# Patient Record
Sex: Female | Born: 1935 | Race: White | Hispanic: No | Smoking: Never smoker
Health system: Southern US, Community
[De-identification: ages and names within clinical notes are randomized; demographics above are authoritative.]

## PROBLEM LIST (undated history)

## (undated) DIAGNOSIS — I10 Essential (primary) hypertension: Secondary | ICD-10-CM

## (undated) DIAGNOSIS — F0390 Unspecified dementia without behavioral disturbance: Secondary | ICD-10-CM

## (undated) DIAGNOSIS — M8580 Other specified disorders of bone density and structure, unspecified site: Secondary | ICD-10-CM

## (undated) DIAGNOSIS — C50919 Malignant neoplasm of unspecified site of unspecified female breast: Secondary | ICD-10-CM

## (undated) DIAGNOSIS — H5440 Blindness, one eye, unspecified eye: Secondary | ICD-10-CM

## (undated) DIAGNOSIS — M1990 Unspecified osteoarthritis, unspecified site: Secondary | ICD-10-CM

## (undated) HISTORY — DX: Essential (primary) hypertension: I10

## (undated) HISTORY — DX: Other specified disorders of bone density and structure, unspecified site: M85.80

## (undated) HISTORY — DX: Unspecified osteoarthritis, unspecified site: M19.90

## (undated) HISTORY — PX: TOTAL HIP ARTHROPLASTY: SHX124

## (undated) HISTORY — PX: ABDOMINAL HYSTERECTOMY: SHX81

## (undated) HISTORY — DX: Malignant neoplasm of unspecified site of unspecified female breast: C50.919

## (undated) HISTORY — PX: BACK SURGERY: SHX140

## (undated) HISTORY — PX: BREAST LUMPECTOMY: SHX2

---

## 2000-12-22 ENCOUNTER — Encounter: Admission: RE | Admit: 2000-12-22 | Discharge: 2000-12-22 | Payer: Self-pay | Admitting: Family Medicine

## 2000-12-22 ENCOUNTER — Encounter: Payer: Self-pay | Admitting: Family Medicine

## 2001-05-18 ENCOUNTER — Encounter: Payer: Self-pay | Admitting: Orthopedic Surgery

## 2001-05-22 ENCOUNTER — Inpatient Hospital Stay (HOSPITAL_COMMUNITY): Admission: RE | Admit: 2001-05-22 | Discharge: 2001-05-27 | Payer: Self-pay | Admitting: Orthopedic Surgery

## 2001-05-22 ENCOUNTER — Encounter: Payer: Self-pay | Admitting: Orthopedic Surgery

## 2001-10-08 ENCOUNTER — Emergency Department (HOSPITAL_COMMUNITY): Admission: EM | Admit: 2001-10-08 | Discharge: 2001-10-09 | Payer: Self-pay | Admitting: *Deleted

## 2002-06-05 ENCOUNTER — Encounter: Payer: Self-pay | Admitting: Orthopedic Surgery

## 2002-06-11 ENCOUNTER — Inpatient Hospital Stay (HOSPITAL_COMMUNITY): Admission: RE | Admit: 2002-06-11 | Discharge: 2002-06-16 | Payer: Self-pay | Admitting: Orthopedic Surgery

## 2002-06-11 ENCOUNTER — Encounter: Payer: Self-pay | Admitting: Orthopedic Surgery

## 2003-11-08 ENCOUNTER — Encounter: Admission: RE | Admit: 2003-11-08 | Discharge: 2003-11-08 | Payer: Self-pay | Admitting: Gastroenterology

## 2004-08-06 ENCOUNTER — Ambulatory Visit (HOSPITAL_COMMUNITY): Admission: RE | Admit: 2004-08-06 | Discharge: 2004-08-07 | Payer: Self-pay | Admitting: Ophthalmology

## 2004-08-11 ENCOUNTER — Ambulatory Visit (HOSPITAL_COMMUNITY): Admission: RE | Admit: 2004-08-11 | Discharge: 2004-08-12 | Payer: Self-pay | Admitting: Ophthalmology

## 2010-06-07 ENCOUNTER — Encounter: Payer: Self-pay | Admitting: Gastroenterology

## 2010-12-14 ENCOUNTER — Encounter: Payer: Self-pay | Admitting: Family Medicine

## 2010-12-14 DIAGNOSIS — I10 Essential (primary) hypertension: Secondary | ICD-10-CM | POA: Insufficient documentation

## 2012-08-09 ENCOUNTER — Other Ambulatory Visit: Payer: Self-pay | Admitting: Family Medicine

## 2012-08-14 ENCOUNTER — Telehealth: Payer: Self-pay | Admitting: Physician Assistant

## 2012-08-14 DIAGNOSIS — E039 Hypothyroidism, unspecified: Secondary | ICD-10-CM

## 2012-08-14 MED ORDER — LEVOTHYROXINE SODIUM 25 MCG PO TABS: 25.0000 ug | ORAL_TABLET | Freq: Every day | ORAL | Status: AC

## 2012-08-14 NOTE — Telephone Encounter (Signed)
Patient needs to be seen before any further refillsMedication refilled per protocol.

## 2012-12-08 ENCOUNTER — Emergency Department (INDEPENDENT_AMBULATORY_CARE_PROVIDER_SITE_OTHER): Payer: MEDICARE

## 2012-12-08 ENCOUNTER — Emergency Department (INDEPENDENT_AMBULATORY_CARE_PROVIDER_SITE_OTHER)
Admission: EM | Admit: 2012-12-08 | Discharge: 2012-12-08 | Disposition: A | Discharge status: Home / Self Care | Payer: MEDICARE | Source: Home / Self Care

## 2012-12-08 ENCOUNTER — Encounter (HOSPITAL_COMMUNITY): Payer: Self-pay

## 2012-12-08 DIAGNOSIS — S8012XA Contusion of left lower leg, initial encounter: Secondary | ICD-10-CM

## 2012-12-08 DIAGNOSIS — S8010XA Contusion of unspecified lower leg, initial encounter: Secondary | ICD-10-CM

## 2012-12-08 DIAGNOSIS — G5600 Carpal tunnel syndrome, unspecified upper limb: Secondary | ICD-10-CM

## 2012-12-08 DIAGNOSIS — M5030 Other cervical disc degeneration, unspecified cervical region: Secondary | ICD-10-CM

## 2012-12-08 DIAGNOSIS — W19XXXA Unspecified fall, initial encounter: Secondary | ICD-10-CM

## 2012-12-08 DIAGNOSIS — N390 Urinary tract infection, site not specified: Secondary | ICD-10-CM

## 2012-12-08 DIAGNOSIS — G5603 Carpal tunnel syndrome, bilateral upper limbs: Secondary | ICD-10-CM

## 2012-12-08 HISTORY — DX: Blindness, one eye, unspecified eye: H54.40

## 2012-12-08 LAB — POCT URINALYSIS DIP (DEVICE)
Glucose, UA: NEGATIVE mg/dL
Ketones, ur: NEGATIVE mg/dL
Nitrite: NEGATIVE
Protein, ur: NEGATIVE mg/dL

## 2012-12-08 MED ORDER — CIPROFLOXACIN HCL 250 MG PO TABS: 250.0000 mg | ORAL_TABLET | Freq: Two times a day (BID) | ORAL | Status: DC

## 2012-12-08 NOTE — ED Notes (Addendum)
Reportedly fell earlier today in her home bathroom (walking w/o her walker) c/o pain both lower extremities on palpation . Denies LOC or head injury, or soiling herself. No pain w palpation of t-spine or L-spine or new pain C-spine (states her c-spine hurts "all the time")

## 2012-12-14 NOTE — ED Provider Notes (Signed)
CSN: [id number]     Arrival date & time 12/08/12  1500 History     First MD Initiated Contact with Patient 12/08/12 1621     Chief Complaint  Patient presents with  . Fall   (Consider location/radiation/quality/duration/timing/severity/associated sxs/prior Treatment) HPI  79 yo wf comes in with with above complaint.  With her daughter today.  States that she fell in her home when she was walking without her walker.  She lost her balance.  Complaining of left lower leg pain and neck pain.  Chronic hx of neck pain with know DDD.  She is s/p bilat total hip replacements by dr Despina Hick several years ago.  Denies upper/lower ext radicular symptoms.  Patient also has a low grade fever today.  Denies feeling sick. Denies dysuria, hematuria, abd pain, nausea, vomiting, cough, sob.    Past Medical History  Diagnosis Date  . Breast cancer   . Osteoarthritis   . Osteopenia   . Hypertension   . Blind right eye    Past Surgical History  Procedure Laterality Date  . Total hip arthroplasty      2  . Back surgery    . Breast lumpectomy    . Abdominal hysterectomy     History reviewed. No pertinent family history. History  Substance Use Topics  . Smoking status: Not on file  . Smokeless tobacco: Not on file  . Alcohol Use: Not on file   OB History   Grav Para Term Preterm Abortions TAB SAB Ect Mult Living                 Review of Systems  Constitutional: Negative.   HENT: Negative.   Eyes: Negative.   Respiratory: Negative.   Cardiovascular: Negative.   Gastrointestinal: Negative.   Genitourinary: Negative.   Musculoskeletal:       Neck pain, and left lower leg pain.    Skin: Negative.   Neurological: Negative.   Psychiatric/Behavioral: Negative.     Allergies  Benazepril and Norvasc  Home Medications   Current Outpatient Rx  Name  Route  Sig  Dispense  Refill  . amLODipine (NORVASC) 5 MG tablet   Oral   Take 5 mg by mouth daily.           . celecoxib (CELEBREX) 200  MG capsule   Oral   Take 200 mg by mouth 2 (two) times daily.           . Cholecalciferol (VITAMIN D) 2000 UNITS CAPS   Oral   Take by mouth.           . ciprofloxacin (CIPRO) 250 MG tablet   Oral   Take 1 tablet (250 mg total) by mouth every 12 (twelve) hours.   14 tablet   0   . furosemide (LASIX) 20 MG tablet   Oral   Take 20 mg by mouth daily.           Marland Kitchen levothyroxine (SYNTHROID, LEVOTHROID) 25 MCG tablet   Oral   Take 1 tablet (25 mcg total) by mouth daily.   30 tablet   0     Patient needs to be seen before any further refill ...   . ranitidine (ZANTAC) 75 MG tablet   Oral   Take 75 mg by mouth 2 (two) times daily.            BP 130/63  Pulse 65  Temp(Src) 99.4 F (37.4 C) (Oral)  Resp 18  SpO2 96%  Physical Exam  Constitutional: She appears well-developed and well-nourished.  HENT:  Head: Normocephalic and atraumatic.  Eyes: EOM are normal. Pupils are equal, round, and reactive to light.  Neck: Normal range of motion. Neck supple.  Pulmonary/Chest: Effort normal.  Abdominal: Soft.  Musculoskeletal: Normal range of motion. She exhibits tenderness (distal left tibia.  no bruising, swelling.  good painless rom bilat hips and knees.  ).  Neurological: She is alert.  Skin: Skin is warm and dry.  Psychiatric: She has a normal mood and affect.    ED Course   Procedures (including critical care time)  Labs Reviewed  POCT URINALYSIS DIP (DEVICE) - Abnormal; Notable for the following:    Hgb urine dipstick SMALL (*)    Leukocytes, UA TRACE (*)    All other components within normal limits   No results found. 1. Contusion of left leg, initial encounter   2. DDD (degenerative disc disease), cervical   3. Fall, initial encounter   4. Carpal tunnel syndrome on both sides   5. UTI (lower urinary tract infection)     MDM  Patient and daughter advised there were no fractures seen on xray today.  Does have marked cervical ddd/spondylosis.  No acute  changes.  bilat hip replacements intact.  Encouraged to use her walker at all times when she is ambulating.  Needs to f/u with dr Despina Hick in 1-2 weeks for recheck.  Return to urgent care or go to ED if needs to be seen sooner.  Voices understanding.  Given a ASO for distal tibia/ankle pain.    Zonia Kief, PA-C 12/14/12 1450

## 2012-12-14 NOTE — ED Provider Notes (Signed)
Medical screening examination/treatment/procedure(s) were performed by resident physician or non-physician practitioner and as supervising physician I was immediately available for consultation/collaboration.   Deionna Marcantonio DOUGLAS MD.   Holly Mckee D Charmin Aguiniga, MD 12/14/12 2018 

## 2014-04-30 ENCOUNTER — Other Ambulatory Visit: Payer: Self-pay | Admitting: Ophthalmology

## 2014-05-06 ENCOUNTER — Encounter (HOSPITAL_COMMUNITY): Admission: RE | Payer: Self-pay | Source: Ambulatory Visit

## 2014-05-06 ENCOUNTER — Ambulatory Visit (HOSPITAL_COMMUNITY): Admission: RE | Admit: 2014-05-06 | Payer: MEDICARE | Source: Ambulatory Visit | Admitting: Ophthalmology

## 2014-05-06 SURGERY — EXAM UNDER ANESTHESIA, EYE
Anesthesia: Monitor Anesthesia Care | Laterality: Right

## 2014-10-22 ENCOUNTER — Inpatient Hospital Stay (HOSPITAL_COMMUNITY)
Admission: EM | Admit: 2014-10-22 | Discharge: 2014-10-25 | DRG: 690 | Disposition: A | Discharge status: Skilled Nursing Facility | Payer: MEDICARE | Attending: Family Medicine | Admitting: Family Medicine

## 2014-10-22 ENCOUNTER — Encounter (HOSPITAL_COMMUNITY): Payer: Self-pay | Admitting: *Deleted

## 2014-10-22 ENCOUNTER — Emergency Department (HOSPITAL_COMMUNITY): Payer: MEDICARE

## 2014-10-22 DIAGNOSIS — I248 Other forms of acute ischemic heart disease: Secondary | ICD-10-CM | POA: Diagnosis present

## 2014-10-22 DIAGNOSIS — Z8249 Family history of ischemic heart disease and other diseases of the circulatory system: Secondary | ICD-10-CM

## 2014-10-22 DIAGNOSIS — N3000 Acute cystitis without hematuria: Secondary | ICD-10-CM | POA: Diagnosis not present

## 2014-10-22 DIAGNOSIS — R7989 Other specified abnormal findings of blood chemistry: Secondary | ICD-10-CM

## 2014-10-22 DIAGNOSIS — B9620 Unspecified Escherichia coli [E. coli] as the cause of diseases classified elsewhere: Secondary | ICD-10-CM | POA: Diagnosis present

## 2014-10-22 DIAGNOSIS — M8580 Other specified disorders of bone density and structure, unspecified site: Secondary | ICD-10-CM | POA: Diagnosis present

## 2014-10-22 DIAGNOSIS — E860 Dehydration: Secondary | ICD-10-CM | POA: Diagnosis present

## 2014-10-22 DIAGNOSIS — M1990 Unspecified osteoarthritis, unspecified site: Secondary | ICD-10-CM | POA: Insufficient documentation

## 2014-10-22 DIAGNOSIS — R778 Other specified abnormalities of plasma proteins: Secondary | ICD-10-CM

## 2014-10-22 DIAGNOSIS — H5441 Blindness, right eye, normal vision left eye: Secondary | ICD-10-CM | POA: Diagnosis present

## 2014-10-22 DIAGNOSIS — Z96649 Presence of unspecified artificial hip joint: Secondary | ICD-10-CM | POA: Diagnosis present

## 2014-10-22 DIAGNOSIS — R509 Fever, unspecified: Secondary | ICD-10-CM | POA: Diagnosis not present

## 2014-10-22 DIAGNOSIS — Z853 Personal history of malignant neoplasm of breast: Secondary | ICD-10-CM

## 2014-10-22 DIAGNOSIS — E876 Hypokalemia: Secondary | ICD-10-CM | POA: Diagnosis present

## 2014-10-22 DIAGNOSIS — E039 Hypothyroidism, unspecified: Secondary | ICD-10-CM | POA: Insufficient documentation

## 2014-10-22 DIAGNOSIS — N179 Acute kidney failure, unspecified: Secondary | ICD-10-CM | POA: Diagnosis present

## 2014-10-22 DIAGNOSIS — R531 Weakness: Secondary | ICD-10-CM | POA: Diagnosis not present

## 2014-10-22 DIAGNOSIS — I10 Essential (primary) hypertension: Secondary | ICD-10-CM | POA: Diagnosis not present

## 2014-10-22 DIAGNOSIS — N390 Urinary tract infection, site not specified: Principal | ICD-10-CM | POA: Diagnosis present

## 2014-10-22 DIAGNOSIS — D72829 Elevated white blood cell count, unspecified: Secondary | ICD-10-CM | POA: Diagnosis not present

## 2014-10-22 DIAGNOSIS — R6883 Chills (without fever): Secondary | ICD-10-CM | POA: Diagnosis not present

## 2014-10-22 DIAGNOSIS — R0902 Hypoxemia: Secondary | ICD-10-CM

## 2014-10-22 DIAGNOSIS — D649 Anemia, unspecified: Secondary | ICD-10-CM | POA: Diagnosis not present

## 2014-10-22 DIAGNOSIS — M7989 Other specified soft tissue disorders: Secondary | ICD-10-CM | POA: Diagnosis not present

## 2014-10-22 LAB — COMPREHENSIVE METABOLIC PANEL
ALBUMIN: 3.3 g/dL — AB (ref 3.5–5.0)
ALT: 22 U/L (ref 14–54)
AST: 29 U/L (ref 15–41)
Alkaline Phosphatase: 117 U/L (ref 38–126)
Anion gap: 9 (ref 5–15)
BILIRUBIN TOTAL: 1.3 mg/dL — AB (ref 0.3–1.2)
BUN: 27 mg/dL — AB (ref 6–20)
CALCIUM: 9.8 mg/dL (ref 8.9–10.3)
CHLORIDE: 105 mmol/L (ref 101–111)
CO2: 22 mmol/L (ref 22–32)
Creatinine, Ser: 1.25 mg/dL — ABNORMAL HIGH (ref 0.44–1.00)
GFR calc Af Amer: 42 mL/min — ABNORMAL LOW (ref >60)
GFR, EST NON AFRICAN AMERICAN: 36 mL/min — AB (ref >60)
Glucose, Bld: 106 mg/dL — ABNORMAL HIGH (ref 65–99)
POTASSIUM: 3 mmol/L — AB (ref 3.5–5.1)
SODIUM: 136 mmol/L (ref 135–145)
Total Protein: 6.2 g/dL — ABNORMAL LOW (ref 6.5–8.1)

## 2014-10-22 LAB — CBC WITH DIFFERENTIAL/PLATELET
Basophils Absolute: 0 10*3/uL (ref 0.0–0.1)
Basophils Relative: 0 % (ref 0–1)
EOS ABS: 0 10*3/uL (ref 0.0–0.7)
Eosinophils Relative: 0 % (ref 0–5)
HCT: 35.2 % — ABNORMAL LOW (ref 36.0–46.0)
Hemoglobin: 11.6 g/dL — ABNORMAL LOW (ref 12.0–15.0)
LYMPHS ABS: 0.3 10*3/uL — AB (ref 0.7–4.0)
LYMPHS PCT: 3 % — AB (ref 12–46)
MCH: 31.2 pg (ref 26.0–34.0)
MCHC: 33 g/dL (ref 30.0–36.0)
MCV: 94.6 fL (ref 78.0–100.0)
Monocytes Absolute: 0.1 10*3/uL (ref 0.1–1.0)
Monocytes Relative: 2 % — ABNORMAL LOW (ref 3–12)
NEUTROS ABS: 8.4 10*3/uL — AB (ref 1.7–7.7)
Neutrophils Relative %: 95 % — ABNORMAL HIGH (ref 43–77)
Platelets: 103 10*3/uL — ABNORMAL LOW (ref 150–400)
RBC: 3.72 MIL/uL — AB (ref 3.87–5.11)
RDW: 13.6 % (ref 11.5–15.5)
WBC: 8.8 10*3/uL (ref 4.0–10.5)

## 2014-10-22 LAB — URINE MICROSCOPIC-ADD ON

## 2014-10-22 LAB — BRAIN NATRIURETIC PEPTIDE: B NATRIURETIC PEPTIDE 5: 739.7 pg/mL — AB (ref 0.0–100.0)

## 2014-10-22 LAB — URINALYSIS, ROUTINE W REFLEX MICROSCOPIC
BILIRUBIN URINE: NEGATIVE
Glucose, UA: NEGATIVE mg/dL
KETONES UR: NEGATIVE mg/dL
NITRITE: POSITIVE — AB
PROTEIN: 30 mg/dL — AB
SPECIFIC GRAVITY, URINE: 1.012 (ref 1.005–1.030)
Urobilinogen, UA: 1 mg/dL (ref 0.0–1.0)
pH: 5 (ref 5.0–8.0)

## 2014-10-22 LAB — TROPONIN I: TROPONIN I: 0.1 ng/mL — AB (ref <0.031)

## 2014-10-22 MED ORDER — DEXTROSE 5 % IV SOLN
1.0000 g | Freq: Once | INTRAVENOUS | Status: AC
  Administered 2014-10-22: 1 g via INTRAVENOUS
  Filled 2014-10-22: qty 10

## 2014-10-22 MED ORDER — ACETAMINOPHEN 650 MG RE SUPP: 650.0000 mg | Freq: Four times a day (QID) | RECTAL | Status: DC | PRN

## 2014-10-22 MED ORDER — DEXTROSE 5 % IV SOLN
1.0000 g | INTRAVENOUS | Status: DC
  Administered 2014-10-23 – 2014-10-24 (×2): 1 g via INTRAVENOUS
  Filled 2014-10-22 (×4): qty 10

## 2014-10-22 MED ORDER — ONDANSETRON HCL 4 MG/2ML IJ SOLN: 4.0000 mg | Freq: Four times a day (QID) | INTRAMUSCULAR | Status: DC | PRN

## 2014-10-22 MED ORDER — ALUM & MAG HYDROXIDE-SIMETH 200-200-20 MG/5ML PO SUSP: 30.0000 mL | Freq: Four times a day (QID) | ORAL | Status: DC | PRN

## 2014-10-22 MED ORDER — ONDANSETRON HCL 4 MG PO TABS: 4.0000 mg | ORAL_TABLET | Freq: Four times a day (QID) | ORAL | Status: DC | PRN

## 2014-10-22 MED ORDER — POTASSIUM CHLORIDE 10 MEQ/100ML IV SOLN
10.0000 meq | INTRAVENOUS | Status: AC
  Administered 2014-10-23 (×3): 10 meq via INTRAVENOUS
  Filled 2014-10-22 (×3): qty 100

## 2014-10-22 MED ORDER — LEVOTHYROXINE SODIUM 25 MCG PO TABS
25.0000 ug | ORAL_TABLET | Freq: Every day | ORAL | Status: DC
  Administered 2014-10-23 – 2014-10-25 (×3): 25 ug via ORAL
  Filled 2014-10-22 (×4): qty 1

## 2014-10-22 MED ORDER — ENOXAPARIN SODIUM 40 MG/0.4ML ~~LOC~~ SOLN
40.0000 mg | SUBCUTANEOUS | Status: DC
  Administered 2014-10-23 – 2014-10-25 (×3): 40 mg via SUBCUTANEOUS
  Filled 2014-10-22 (×3): qty 0.4

## 2014-10-22 MED ORDER — SODIUM CHLORIDE 0.9 % IV SOLN
INTRAVENOUS | Status: DC
  Administered 2014-10-23: via INTRAVENOUS

## 2014-10-22 MED ORDER — ACETAMINOPHEN 325 MG PO TABS
650.0000 mg | ORAL_TABLET | Freq: Four times a day (QID) | ORAL | Status: DC | PRN
Start: 2014-10-22 — End: 2014-10-25
  Administered 2014-10-23 – 2014-10-25 (×3): 650 mg via ORAL
  Filled 2014-10-22 (×3): qty 2

## 2014-10-22 NOTE — ED Notes (Signed)
Bed: WA07 Expected date:  Expected time:  Means of arrival:  Comments: EMS 81 yr old hypothermia

## 2014-10-22 NOTE — ED Notes (Signed)
Patient transported to X-ray 

## 2014-10-22 NOTE — ED Notes (Signed)
Pt states she is unsure of why she is here, when asked about urine pt does states she has had a odor and more incontinence, denies any other symptoms, denies pain, pts family states she hasn't been feeling good past couple days and this evening was shivering and then stopped and felt like she had a fever.

## 2014-10-22 NOTE — H&P (Addendum)
History and Physical  Holly Mckee YTK:[id number] DOB: 10-26-1919 DOA: 10/22/2014  Referring physician:  Dr. Alvino Chapel, ED physician PCP: Delia Chimes, NP   Chief Complaint:  chills  HPI: Holly Mckee is a 81 y.o. female   with a history of hypertension, breast cancer approximately 4 years ago, osteoarthritis, osteopenia , hypothyroidism who was brought to the emergency department due to complaints of fevers and chills. This started earlier today, with no palliating or provoking factors. She also complains of generalized malaise over the past week which is increased.   Review of Systems:   Pt denies any  Chest pain, shortness of breath, palpitations, abdominal pain, nausea, vomitingdiarrhea, constipation, decreased appetite , dysuria, polyuria.  Review of systems are otherwise negative  Past Medical History  Diagnosis Date  . Breast cancer   . Osteoarthritis   . Osteopenia   . Hypertension   . Blind right eye    Past Surgical History  Procedure Laterality Date  . Total hip arthroplasty      2  . Back surgery    . Breast lumpectomy    . Abdominal hysterectomy     Social History:  reports that she does not drink alcohol or use illicit drugs. Her tobacco history is not on file. Patient lives at  home & is able to participate in activities of daily living   Allergies  Allergen Reactions  . Benazepril   . Norvasc [Amlodipine Besylate]     Reaction:Edema    Family History  Problem Relation Age of Onset  . Hypertension Father   . Hypertension Sister      Prior to Admission medications   Medication Sig Start Date End Date Taking? Authorizing Provider  amLODipine (NORVASC) 5 MG tablet Take 5 mg by mouth daily.      Historical Provider, MD  celecoxib (CELEBREX) 200 MG capsule Take 200 mg by mouth 2 (two) times daily.      Historical Provider, MD  Cholecalciferol (VITAMIN D) 2000 UNITS CAPS Take by mouth.      Historical Provider, MD  ciprofloxacin (CIPRO) 250 MG  tablet Take 1 tablet (250 mg total) by mouth every 12 (twelve) hours. 12/08/12   Lanae Crumbly, PA-C  furosemide (LASIX) 20 MG tablet Take 20 mg by mouth daily.      Historical Provider, MD  levothyroxine (SYNTHROID, LEVOTHROID) 25 MCG tablet Take 1 tablet (25 mcg total) by mouth daily. 08/14/12   Lonie Peak Dixon, PA-C  ranitidine (ZANTAC) 75 MG tablet Take 75 mg by mouth 2 (two) times daily.      Historical Provider, MD    Physical Exam: BP 88/50 mmHg  Pulse 84  Temp(Src) 98.8 F (37.1 C) (Oral)  Resp 20  SpO2 97%  General:  Elderly Caucasian female. Awake and alert and oriented x3. No acute cardiopulmonary distress.  Eyes: Pupils equal, round, reactive to light. Extraocular muscles are intact. Sclerae anicteric and noninjected.  ENT:   dry mucosal membranes. No mucosal lesions.   Neck: Neck supple without lymphadenopathy. No carotid bruits. No masses palpated.  Cardiovascular: Regular rate with normal S1-S2 sounds. No murmurs, rubs, gallops auscultated. No JVD.  Respiratory: Good respiratory effort with no wheezes, rales, rhonchi. Lungs clear to auscultation bilaterally.  Abdomen: Soft, nontender, nondistended. Active bowel sounds. No masses or hepatosplenomegaly  Skin: Dry, warm to touch. 2+ dorsalis pedis and radial pulses. Musculoskeletal: No calf or leg pain. All major joints not erythematous nontender.  Psychiatric: Intact judgment and insight.  Neurologic: No  focal neurological deficits. Cranial nerves II through XII are grossly intact.           Labs on Admission:  Basic Metabolic Panel:  Recent Labs Lab 10/22/14 2112  NA 136  K 3.0*  CL 105  CO2 22  GLUCOSE 106*  BUN 27*  CREATININE 1.25*  CALCIUM 9.8   Liver Function Tests:  Recent Labs Lab 10/22/14 2112  AST 29  ALT 22  ALKPHOS 117  BILITOT 1.3*  PROT 6.2*  ALBUMIN 3.3*   No results for input(s): LIPASE, AMYLASE in the last 168 hours. No results for input(s): AMMONIA in the last 168  hours. CBC:  Recent Labs Lab 10/22/14 2112  WBC 8.8  NEUTROABS 8.4*  HGB 11.6*  HCT 35.2*  MCV 94.6  PLT 103*   Cardiac Enzymes:  Recent Labs Lab 10/22/14 2238  TROPONINI 0.10*    BNP (last 3 results)  Recent Labs  10/22/14 2238  BNP 739.7*    ProBNP (last 3 results) No results for input(s): PROBNP in the last 8760 hours.  CBG: No results for input(s): GLUCAP in the last 168 hours.  Radiological Exams on Admission: Dg Chest 2 View  10/22/2014   CLINICAL DATA:  81 year old with intermittent fever for the past 24 hours.  EXAM: CHEST  2 VIEW  COMPARISON:  Prior chest x-ray 08/06/2004  FINDINGS: Cardiac and mediastinal contours remain unchanged. Ectatic atherosclerotic thoracic aorta. Calcification of the mitral valve annulus. The annulus appears dilated. Marked elevation of the right hemidiaphragm with colonic interposition again noted. Slightly right lower lobe atelectasis. Pulmonary vascular congestion without overt edema. No definite focal airspace consolidation. No pleural or pneumothorax. Surgical clips present in the right axilla. Acute osseous abnormality  IMPRESSION: 1. Pulmonary vascular congestion without overt edema. 2. Calcified and likely dilated mitral valve annulus. 3. Tortuous and atherosclerotic thoracic aorta. 4. Marked elevation of the right again noted with associated right middle lobe atelectasis.   Electronically Signed   By: Jacqulynn Cadet M.D.   On: 10/22/2014 21:30    EKG: Independently reviewed.  Sinus rhythm. Normal PR interval. QRS at  0.12. No ST changes. Incomplete left bundle branch block  Assessment/Plan Present on Admission:  . Urinary tract infection . Hypokalemia . Acute renal injury . UTI (lower urinary tract infection) . Elevated troponin  This patient was discussed with the ED physician, including pertinent vitals, physical exam findings, labs, and imaging.  We also discussed care given by the ED provider.   #1 urinary tract  infection #2 hypokalemia #3 acute renal injury #4 hypertension  #5 elevated troponin   Admits MedSurg Continue ceftriaxone Urine culture pending  replace potassium Gentle IV fluids overnight  hold antihypertensives due to the pressure being in the high 47Q systolically  elevated troponin likely due to demand ischemia with hypotension. Will trend troponins and alter management based on these values   DVT prophylaxis:  Lovenox  Consultants:  none  Code Status:  Full code   Disposition Plan:  Home following improvement   Truett Mainland, DO [facility] Pager [phone]

## 2014-10-22 NOTE — ED Notes (Signed)
Per EMS pt from home, family called EMS, pt was found shivering, warm to touch, hasn't been feeling well past 2 days, pt states her urine has weird smell and hasn't been urinating normal. BP 112/56, HR 84, CBG 141, 92% RA, was placed on 4L for EMS.

## 2014-10-22 NOTE — ED Provider Notes (Addendum)
CSN: [id number]     Arrival date & time 10/22/14  2029 History   First MD Initiated Contact with Patient 10/22/14 2052     Chief Complaint  Patient presents with  . Urinary Tract Infection     (Consider location/radiation/quality/duration/timing/severity/associated sxs/prior Treatment) Patient is a 81 y.o. female presenting with urinary tract infection. The history is provided by the patient.  Urinary Tract Infection Associated symptoms: no abdominal pain and no flank pain    patient was brought in for chills. Has been feeling a little bit weak. Has had some urinary incontinence. Has had some nasal drainage but no clear cough. Did not have measured fever. Denies abdominal pain. Denies sick contacts. Denies rash. Patient states she just feels a little weak all over. Had some urinary incontinence which is not that unusual for her. No bowel incontinence.  Past Medical History  Diagnosis Date  . Breast cancer   . Osteoarthritis   . Osteopenia   . Hypertension   . Blind right eye    Past Surgical History  Procedure Laterality Date  . Total hip arthroplasty      2  . Back surgery    . Breast lumpectomy    . Abdominal hysterectomy     No family history on file. History  Substance Use Topics  . Smoking status: Unknown If Ever Smoked  . Smokeless tobacco: Not on file  . Alcohol Use: No   OB History    No data available     Review of Systems  Constitutional: Positive for chills and fatigue. Negative for appetite change.  Respiratory: Positive for cough. Negative for shortness of breath.   Cardiovascular: Negative for chest pain.  Gastrointestinal: Negative for abdominal pain.  Genitourinary: Negative for hematuria and flank pain.       Urinary incontinence  Musculoskeletal: Negative for back pain.  Psychiatric/Behavioral: Negative for behavioral problems.      Allergies  Benazepril and Norvasc  Home Medications   Prior to Admission medications   Medication Sig Start  Date End Date Taking? Authorizing Provider  amLODipine (NORVASC) 5 MG tablet Take 5 mg by mouth daily.      Historical Provider, MD  celecoxib (CELEBREX) 200 MG capsule Take 200 mg by mouth 2 (two) times daily.      Historical Provider, MD  Cholecalciferol (VITAMIN D) 2000 UNITS CAPS Take by mouth.      Historical Provider, MD  ciprofloxacin (CIPRO) 250 MG tablet Take 1 tablet (250 mg total) by mouth every 12 (twelve) hours. 12/08/12   Lanae Crumbly, PA-C  furosemide (LASIX) 20 MG tablet Take 20 mg by mouth daily.      Historical Provider, MD  levothyroxine (SYNTHROID, LEVOTHROID) 25 MCG tablet Take 1 tablet (25 mcg total) by mouth daily. 08/14/12   Lonie Peak Dixon, PA-C  ranitidine (ZANTAC) 75 MG tablet Take 75 mg by mouth 2 (two) times daily.      Historical Provider, MD   BP 88/50 mmHg  Pulse 84  Temp(Src) 98.8 F (37.1 C) (Oral)  Resp 20  SpO2 97% Physical Exam  Constitutional: She appears well-developed.  HENT:  Head: Atraumatic.  Cardiovascular: Normal rate and regular rhythm.   Pulmonary/Chest: Effort normal.  Slightly harsh breath sounds  Abdominal: Soft. There is no tenderness.  Genitourinary:  No CVA tenderness.  Musculoskeletal: Normal range of motion. She exhibits no edema.  Neurological: She is alert.  Skin: Skin is warm.    ED Course  Procedures (including critical care  time) Labs Review Labs Reviewed  COMPREHENSIVE METABOLIC PANEL - Abnormal; Notable for the following:    Potassium 3.0 (*)    Glucose, Bld 106 (*)    BUN 27 (*)    Creatinine, Ser 1.25 (*)    Total Protein 6.2 (*)    Albumin 3.3 (*)    Total Bilirubin 1.3 (*)    GFR calc non Af Amer 36 (*)    GFR calc Af Amer 42 (*)    All other components within normal limits  CBC WITH DIFFERENTIAL/PLATELET - Abnormal; Notable for the following:    RBC 3.72 (*)    Hemoglobin 11.6 (*)    HCT 35.2 (*)    Platelets 103 (*)    Neutrophils Relative % 95 (*)    Neutro Abs 8.4 (*)    Lymphocytes Relative 3 (*)     Lymphs Abs 0.3 (*)    Monocytes Relative 2 (*)    All other components within normal limits  URINALYSIS, ROUTINE W REFLEX MICROSCOPIC (NOT AT [facility]) - Abnormal; Notable for the following:    APPearance CLOUDY (*)    Hgb urine dipstick LARGE (*)    Protein, ur 30 (*)    Nitrite POSITIVE (*)    Leukocytes, UA TRACE (*)    All other components within normal limits  URINE MICROSCOPIC-ADD ON - Abnormal; Notable for the following:    Bacteria, UA FEW (*)    All other components within normal limits  URINE CULTURE  TROPONIN I  BRAIN NATRIURETIC PEPTIDE    Imaging Review Dg Chest 2 View  10/22/2014   CLINICAL DATA:  81 year old with intermittent fever for the past 24 hours.  EXAM: CHEST  2 VIEW  COMPARISON:  Prior chest x-ray 08/06/2004  FINDINGS: Cardiac and mediastinal contours remain unchanged. Ectatic atherosclerotic thoracic aorta. Calcification of the mitral valve annulus. The annulus appears dilated. Marked elevation of the right hemidiaphragm with colonic interposition again noted. Slightly right lower lobe atelectasis. Pulmonary vascular congestion without overt edema. No definite focal airspace consolidation. No pleural or pneumothorax. Surgical clips present in the right axilla. Acute osseous abnormality  IMPRESSION: 1. Pulmonary vascular congestion without overt edema. 2. Calcified and likely dilated mitral valve annulus. 3. Tortuous and atherosclerotic thoracic aorta. 4. Marked elevation of the right again noted with associated right middle lobe atelectasis.   Electronically Signed   By: Jacqulynn Cadet M.D.   On: 10/22/2014 21:30     EKG Interpretation   Date/Time:  Tuesday October 22 2014 22:48:52 EDT Ventricular Rate:  81 PR Interval:  153 QRS Duration: 120 QT Interval:  408 QTC Calculation: 474 R Axis:   6 Text Interpretation:  Sinus rhythm Atrial premature complex Incomplete  left bundle branch block Anterior Q waves, possibly due to ILBBB Baseline  wander in lead(s) II  III aVF V6 Confirmed by Alvino Chapel  MD, Ovid Curd [phone])  on 10/22/2014 11:09:43 PM      MDM   Final diagnoses:  Urinary tract infection without hematuria, site unspecified  Hypoxia    Patient with urinary tract infection, has had fevers and chills. Also has slight hypoxia. Lung exam reassuring and x-ray reassuring. Will admit to internal medicine.    Davonna Belling, MD 10/22/14 2310  Patient now has mild hypotension tension and a minimally elevated troponin. Dr. Nehemiah Settle from internal medicine notified and patient will be admitted to telemetry bed now  Davonna Belling, MD 10/23/14 [phone]

## 2014-10-23 ENCOUNTER — Inpatient Hospital Stay (HOSPITAL_COMMUNITY): Payer: MEDICARE

## 2014-10-23 ENCOUNTER — Encounter (HOSPITAL_COMMUNITY): Payer: Self-pay | Admitting: *Deleted

## 2014-10-23 DIAGNOSIS — R7989 Other specified abnormal findings of blood chemistry: Secondary | ICD-10-CM

## 2014-10-23 DIAGNOSIS — R778 Other specified abnormalities of plasma proteins: Secondary | ICD-10-CM | POA: Diagnosis present

## 2014-10-23 DIAGNOSIS — R509 Fever, unspecified: Secondary | ICD-10-CM

## 2014-10-23 DIAGNOSIS — N3000 Acute cystitis without hematuria: Secondary | ICD-10-CM

## 2014-10-23 DIAGNOSIS — N390 Urinary tract infection, site not specified: Secondary | ICD-10-CM | POA: Diagnosis present

## 2014-10-23 DIAGNOSIS — E876 Hypokalemia: Secondary | ICD-10-CM

## 2014-10-23 LAB — BASIC METABOLIC PANEL
ANION GAP: 8 (ref 5–15)
BUN: 27 mg/dL — ABNORMAL HIGH (ref 6–20)
CALCIUM: 9.3 mg/dL (ref 8.9–10.3)
CO2: 25 mmol/L (ref 22–32)
Chloride: 105 mmol/L (ref 101–111)
Creatinine, Ser: 1.21 mg/dL — ABNORMAL HIGH (ref 0.44–1.00)
GFR calc Af Amer: 44 mL/min — ABNORMAL LOW (ref >60)
GFR calc non Af Amer: 38 mL/min — ABNORMAL LOW (ref >60)
GLUCOSE: 127 mg/dL — AB (ref 65–99)
POTASSIUM: 3.8 mmol/L (ref 3.5–5.1)
Sodium: 138 mmol/L (ref 135–145)

## 2014-10-23 LAB — CBC
HCT: 31 % — ABNORMAL LOW (ref 36.0–46.0)
Hemoglobin: 10.1 g/dL — ABNORMAL LOW (ref 12.0–15.0)
MCH: 31 pg (ref 26.0–34.0)
MCHC: 32.6 g/dL (ref 30.0–36.0)
MCV: 95.1 fL (ref 78.0–100.0)
PLATELETS: 104 10*3/uL — AB (ref 150–400)
RBC: 3.26 MIL/uL — AB (ref 3.87–5.11)
RDW: 13.8 % (ref 11.5–15.5)
WBC: 16.6 10*3/uL — AB (ref 4.0–10.5)

## 2014-10-23 LAB — TROPONIN I
TROPONIN I: 0.05 ng/mL — AB (ref <0.031)
Troponin I: 0.05 ng/mL — ABNORMAL HIGH (ref <0.031)

## 2014-10-23 MED ORDER — SODIUM CHLORIDE 0.9 % IV SOLN
INTRAVENOUS | Status: AC
  Administered 2014-10-23: 50 mL/h via INTRAVENOUS

## 2014-10-23 MED ORDER — LORAZEPAM 1 MG PO TABS
1.0000 mg | ORAL_TABLET | Freq: Once | ORAL | Status: AC
  Administered 2014-10-23: 1 mg via ORAL
  Filled 2014-10-23: qty 1

## 2014-10-23 NOTE — ED Notes (Signed)
FAMILY REQUESTING FOR PT TO TAKE HOME EYE DROPS. SENT PHARMACY IN TO TAKE DOWN EYE DROPS SO IT COULD BE ADDED TO MAR. FAMILY REQUESTING TO GIVEN MEDICATION AT THAT TIME. GIVEN BY FAMILY.

## 2014-10-23 NOTE — ED Notes (Signed)
ADMISSION MD PRESENT TO EVALUATE THIS PT. DELAY IN TRANSFER

## 2014-10-23 NOTE — ED Notes (Signed)
Breakfast tray given to pt 

## 2014-10-23 NOTE — Progress Notes (Signed)
Echocardiogram 2D Echocardiogram has been performed.  Holly Mckee 10/23/2014, 2:40 PM

## 2014-10-23 NOTE — Progress Notes (Signed)
Patient Demographics  Holly Mckee, is a 81 y.o. female, DOB - 1919/06/08, LOV:[id number]  Admit date - 10/22/2014   Admitting Physician Truett Mainland, DO  Outpatient Primary MD for the patient is Delia Chimes, NP  LOS - 1   Chief Complaint  Patient presents with  . Urinary Tract Infection       Admission HPI/Brief narrative: 81 y.o. Female with a history of hypertension, breast cancer approximately 4 years ago, osteoarthritis, osteopenia , hypothyroidism who was brought to the emergency department due to complaints of fevers and chills. She also complains of generalized malaise over the past week which is increased, workup in ED was significant for UTI, mildly elevated troponin.  Subjective:   Holly Mckee today has, No headache, No chest pain, No abdominal pain - No Nausea, No new weakness tingling or numbness, No Cough - SOB.  Assessment & Plan   Active Problems:   Urinary tract infection   Hypokalemia   Acute renal injury   UTI (lower urinary tract infection)   Elevated troponin  Weakness - most likely related to dehydration and UTI, will continue with gentle hydration, and treat UTI, consult PT.  UTI - Continue with IV Rocephin, follow on urine culture.  Renal failure: -unclear base line , continue to monitor.  Elevated troponins - Denies any chest pain, denies any shortness of breath, we'll cycle troponins follow the trend, this is most likely related to dehydration, infection, will check 2-D echo as well giving elevated BNP and evidence of vascular congestion on chest x-ray.  Hypokalemia - Repleted, recheck in a.m.  Hypothyroidism - Continue with Synthroid.  Code Status: Full  Family Communication: Daughter at bedside  Disposition Plan: Pending further workup   Procedures  None   Consults   None   Medications  Scheduled Meds: . cefTRIAXone (ROCEPHIN)  IV   1 g Intravenous Q24H  . enoxaparin (LOVENOX) injection  40 mg Subcutaneous Q24H  . levothyroxine  25 mcg Oral Daily   Continuous Infusions: . sodium chloride 50 mL/hr (10/23/14 0937)   PRN Meds:.acetaminophen **OR** acetaminophen, alum & mag hydroxide-simeth, ondansetron **OR** ondansetron (ZOFRAN) IV  DVT Prophylaxis  Lovenox -  Lab Results  Component Value Date   PLT 104* 10/23/2014    Antibiotics    Anti-infectives    Start     Dose/Rate Route Frequency Ordered Stop   10/23/14 0000  cefTRIAXone (ROCEPHIN) 1 g in dextrose 5 % 50 mL IVPB     1 g 100 mL/hr over 30 Minutes Intravenous Every 24 hours 10/22/14 2359     10/22/14 2230  cefTRIAXone (ROCEPHIN) 1 g in dextrose 5 % 50 mL IVPB     1 g 100 mL/hr over 30 Minutes Intravenous  Once 10/22/14 2219 10/22/14 2309          Objective:   Filed Vitals:   10/23/14 1100 10/23/14 1107 10/23/14 1115 10/23/14 1143  BP: 124/59 124/59  130/58  Pulse: 73 75 73 71  Temp:  98.3 F (36.8 C)  97.8 F (36.6 C)  TempSrc:  Oral  Oral  Resp: 22 25 20 20   Height:    5\' 6"  (1.676 m)  Weight:    66.67 kg (146 lb 15.7 oz)  SpO2:  93% 94% 95%    Wt Readings from Last 3 Encounters:  10/23/14 66.67 kg (146 lb 15.7 oz)    No intake or output data in the 24 hours ending 10/23/14 1233   Physical Exam  Awake Alert, Oriented , No new F.N deficits, Normal affect Holly Mckee.AT,PERRAL Supple Neck,No JVD, No cervical lymphadenopathy appriciated.  Symmetrical Chest wall movement, Good air movement bilaterally,  RRR,No Gallops,Rubs or new Murmurs, No Parasternal Heave +ve B.Sounds, Abd Soft, No tenderness, No organomegaly appriciated, No rebound - guarding or rigidity. No Cyanosis, Clubbing or edema, No new Rash or bruise     Data Review   Micro Results No results found for this or any previous visit (from the past 240 hour(s)).  Radiology Reports Dg Chest 2 View  10/22/2014   CLINICAL DATA:  81 year old with intermittent fever for the past  24 hours.  EXAM: CHEST  2 VIEW  COMPARISON:  Prior chest x-ray 08/06/2004  FINDINGS: Cardiac and mediastinal contours remain unchanged. Ectatic atherosclerotic thoracic aorta. Calcification of the mitral valve annulus. The annulus appears dilated. Marked elevation of the right hemidiaphragm with colonic interposition again noted. Slightly right lower lobe atelectasis. Pulmonary vascular congestion without overt edema. No definite focal airspace consolidation. No pleural or pneumothorax. Surgical clips present in the right axilla. Acute osseous abnormality  IMPRESSION: 1. Pulmonary vascular congestion without overt edema. 2. Calcified and likely dilated mitral valve annulus. 3. Tortuous and atherosclerotic thoracic aorta. 4. Marked elevation of the right again noted with associated right middle lobe atelectasis.   Electronically Signed   By: Jacqulynn Cadet M.D.   On: 10/22/2014 21:30     CBC  Recent Labs Lab 10/22/14 2112 10/23/14 0434  WBC 8.8 16.6*  HGB 11.6* 10.1*  HCT 35.2* 31.0*  PLT 103* 104*  MCV 94.6 95.1  MCH 31.2 31.0  MCHC 33.0 32.6  RDW 13.6 13.8  LYMPHSABS 0.3*  --   MONOABS 0.1  --   EOSABS 0  --   BASOSABS 0  --     Chemistries   Recent Labs Lab 10/22/14 2112 10/23/14 0434  NA 136 138  K 3.0* 3.8  CL 105 105  CO2 22 25  GLUCOSE 106* 127*  BUN 27* 27*  CREATININE 1.25* 1.21*  CALCIUM 9.8 9.3  AST 29  --   ALT 22  --   ALKPHOS 117  --   BILITOT 1.3*  --    ------------------------------------------------------------------------------------------------------------------ estimated creatinine clearance is 27.8 mL/min (by C-G formula based on Cr of 1.21). ------------------------------------------------------------------------------------------------------------------ No results for input(s): HGBA1C in the last 72 hours. ------------------------------------------------------------------------------------------------------------------ No results for input(s):  CHOL, HDL, LDLCALC, TRIG, CHOLHDL, LDLDIRECT in the last 72 hours. ------------------------------------------------------------------------------------------------------------------ No results for input(s): TSH, T4TOTAL, T3FREE, THYROIDAB in the last 72 hours.  Invalid input(s): FREET3 ------------------------------------------------------------------------------------------------------------------ No results for input(s): VITAMINB12, FOLATE, FERRITIN, TIBC, IRON, RETICCTPCT in the last 72 hours.  Coagulation profile No results for input(s): INR, PROTIME in the last 168 hours.  No results for input(s): DDIMER in the last 72 hours.  Cardiac Enzymes  Recent Labs Lab 10/22/14 2238  TROPONINI 0.10*   ------------------------------------------------------------------------------------------------------------------ Invalid input(s): POCBNP     Time Spent in minutes   25 minutes   Neomia Herbel M.D on 10/23/2014 at 12:33 PM  Between 7am to 7pm - Pager - [phone]  After 7pm go to [email or web address] - password [facility]  [facility]   Office  [phone]

## 2014-10-24 DIAGNOSIS — N390 Urinary tract infection, site not specified: Principal | ICD-10-CM

## 2014-10-24 LAB — BASIC METABOLIC PANEL
Anion gap: 5 (ref 5–15)
BUN: 26 mg/dL — ABNORMAL HIGH (ref 6–20)
CHLORIDE: 110 mmol/L (ref 101–111)
CO2: 23 mmol/L (ref 22–32)
CREATININE: 0.99 mg/dL (ref 0.44–1.00)
Calcium: 9.3 mg/dL (ref 8.9–10.3)
GFR calc Af Amer: 56 mL/min — ABNORMAL LOW (ref >60)
GFR calc non Af Amer: 48 mL/min — ABNORMAL LOW (ref >60)
Glucose, Bld: 117 mg/dL — ABNORMAL HIGH (ref 65–99)
POTASSIUM: 3.3 mmol/L — AB (ref 3.5–5.1)
Sodium: 138 mmol/L (ref 135–145)

## 2014-10-24 LAB — CBC
HEMATOCRIT: 30.6 % — AB (ref 36.0–46.0)
HEMOGLOBIN: 10.1 g/dL — AB (ref 12.0–15.0)
MCH: 30.5 pg (ref 26.0–34.0)
MCHC: 33 g/dL (ref 30.0–36.0)
MCV: 92.4 fL (ref 78.0–100.0)
PLATELETS: 112 10*3/uL — AB (ref 150–400)
RBC: 3.31 MIL/uL — ABNORMAL LOW (ref 3.87–5.11)
RDW: 13.8 % (ref 11.5–15.5)
WBC: 13.1 10*3/uL — AB (ref 4.0–10.5)

## 2014-10-24 LAB — TROPONIN I: TROPONIN I: 0.04 ng/mL — AB (ref <0.031)

## 2014-10-24 MED ORDER — POTASSIUM CHLORIDE CRYS ER 20 MEQ PO TBCR
40.0000 meq | EXTENDED_RELEASE_TABLET | ORAL | Status: AC
  Administered 2014-10-24 (×2): 40 meq via ORAL
  Filled 2014-10-24 (×2): qty 2

## 2014-10-24 NOTE — Progress Notes (Signed)
Patient Demographics  Holly Mckee, is a 81 y.o. female, DOB - 10-11-1919, ALP:[id number]  Admit date - 10/22/2014   Admitting Physician Truett Mainland, DO  Outpatient Primary MD for the patient is Delia Chimes, NP  LOS - 2   Chief Complaint  Patient presents with  . Urinary Tract Infection       Admission HPI/Brief narrative: 81 y.o. Female with a history of hypertension, breast cancer approximately 4 years ago, osteoarthritis, osteopenia , hypothyroidism who was brought to the emergency department due to complaints of fevers and chills. She also complains of generalized malaise over the past week which is increased, workup in ED was significant for UTI, mildly elevated troponin.  Subjective:   Holly Mckee today has, No headache, No chest pain, No abdominal pain - No Nausea, No new weakness tingling or numbness, No Cough - SOB.  Assessment & Plan   Active Problems:   Urinary tract infection   Hypokalemia   Acute renal injury   UTI (lower urinary tract infection)   Elevated troponin  Weakness - most likely related to dehydration and UTI, PT recommending SNF.  UTI - Continue with IV Rocephin,  - urine culture: NGTD  Renal failure: -unclear base line , continue to monitor.  Elevated troponins - Denies any chest pain, denies any shortness of breath,this is most likely related to dehydration, infection,  - Troponins trending down 0.10>>0.05>>0.05>>0.04 -  2-D echo showing EF 50% ,   with mild lateral hypokinesis , discussed with cardiology , no further workup indicated at this point in the absence of ischemic symptoms or acute CHF .  Hypokalemia - Repleted, recheck in a.m.  Hypothyroidism - Continue with Synthroid.  Code Status: Full  Family Communication: Daughter at bedside  Disposition Plan: in 24 hrs   Procedures  None   Consults    None   Medications  Scheduled Meds: . cefTRIAXone (ROCEPHIN)  IV  1 g Intravenous Q24H  . enoxaparin (LOVENOX) injection  40 mg Subcutaneous Q24H  . levothyroxine  25 mcg Oral Daily  . potassium chloride  40 mEq Oral Q4H   Continuous Infusions:   PRN Meds:.acetaminophen **OR** acetaminophen, alum & mag hydroxide-simeth, ondansetron **OR** ondansetron (ZOFRAN) IV  DVT Prophylaxis  Lovenox -  Lab Results  Component Value Date   PLT 112* 10/24/2014    Antibiotics    Anti-infectives    Start     Dose/Rate Route Frequency Ordered Stop   10/23/14 0000  cefTRIAXone (ROCEPHIN) 1 g in dextrose 5 % 50 mL IVPB     1 g 100 mL/hr over 30 Minutes Intravenous Every 24 hours 10/22/14 2359     10/22/14 2230  cefTRIAXone (ROCEPHIN) 1 g in dextrose 5 % 50 mL IVPB     1 g 100 mL/hr over 30 Minutes Intravenous  Once 10/22/14 2219 10/22/14 2309          Objective:   Filed Vitals:   10/23/14 1353 10/23/14 1519 10/23/14 2112 10/24/14 0519  BP: 142/75 125/57 122/59 154/64  Pulse: 80  67 72  Temp: 98.5 F (36.9 C) 98.3 F (36.8 C) 98.2 F (36.8 C) 97.1 F (36.2 C)  TempSrc: Oral Oral Oral Oral  Resp: 19 19 20 18   Height:  Weight:      SpO2: 97% 95% 95% 96%    Wt Readings from Last 3 Encounters:  10/23/14 66.67 kg (146 lb 15.7 oz)     Intake/Output Summary (Last 24 hours) at 10/24/14 1158 Last data filed at 10/24/14 1002  Gross per 24 hour  Intake 879.17 ml  Output      2 ml  Net 877.17 ml     Physical Exam  Awake Alert, Oriented , No new F.N deficits, Normal affect [place].AT,PERRAL Supple Neck,No JVD, No cervical lymphadenopathy appriciated.  Symmetrical Chest wall movement, Good air movement bilaterally,  RRR,No Gallops,Rubs or new Murmurs, No Parasternal Heave +ve B.Sounds, Abd Soft, No tenderness, No organomegaly appriciated, No rebound - guarding or rigidity. No Cyanosis, Clubbing or edema, No new Rash or bruise     Data Review   Micro Results Recent  Results (from the past 240 hour(s))  Urine culture     Status: None (Preliminary result)   Collection Time: 10/22/14  9:12 PM  Result Value Ref Range Status   Specimen Description URINE, CATHETERIZED  Final   Special Requests NONE  Final   Culture   Final    Culture reincubated for better growth Performed at [facility]    Report Status PENDING  Incomplete    Radiology Reports Dg Chest 2 View  10/22/2014   CLINICAL DATA:  81 year old with intermittent fever for the past 24 hours.  EXAM: CHEST  2 VIEW  COMPARISON:  Prior chest x-ray 08/06/2004  FINDINGS: Cardiac and mediastinal contours remain unchanged. Ectatic atherosclerotic thoracic aorta. Calcification of the mitral valve annulus. The annulus appears dilated. Marked elevation of the right hemidiaphragm with colonic interposition again noted. Slightly right lower lobe atelectasis. Pulmonary vascular congestion without overt edema. No definite focal airspace consolidation. No pleural or pneumothorax. Surgical clips present in the right axilla. Acute osseous abnormality  IMPRESSION: 1. Pulmonary vascular congestion without overt edema. 2. Calcified and likely dilated mitral valve annulus. 3. Tortuous and atherosclerotic thoracic aorta. 4. Marked elevation of the right again noted with associated right middle lobe atelectasis.   Electronically Signed   By: Jacqulynn Cadet M.D.   On: 10/22/2014 21:30     CBC  Recent Labs Lab 10/22/14 2112 10/23/14 0434 10/24/14 0459  WBC 8.8 16.6* 13.1*  HGB 11.6* 10.1* 10.1*  HCT 35.2* 31.0* 30.6*  PLT 103* 104* 112*  MCV 94.6 95.1 92.4  MCH 31.2 31.0 30.5  MCHC 33.0 32.6 33.0  RDW 13.6 13.8 13.8  LYMPHSABS 0.3*  --   --   MONOABS 0.1  --   --   EOSABS 0  --   --   BASOSABS 0  --   --     Chemistries   Recent Labs Lab 10/22/14 2112 10/23/14 0434 10/24/14 0459  NA 136 138 138  K 3.0* 3.8 3.3*  CL 105 105 110  CO2 22 25 23   GLUCOSE 106* 127* 117*  BUN 27* 27* 26*   CREATININE 1.25* 1.21* 0.99  CALCIUM 9.8 9.3 9.3  AST 29  --   --   ALT 22  --   --   ALKPHOS 117  --   --   BILITOT 1.3*  --   --    ------------------------------------------------------------------------------------------------------------------ estimated creatinine clearance is 33.9 mL/min (by C-G formula based on Cr of 0.99). ------------------------------------------------------------------------------------------------------------------ No results for input(s): HGBA1C in the last 72 hours. ------------------------------------------------------------------------------------------------------------------ No results for input(s): CHOL, HDL, LDLCALC, TRIG, CHOLHDL, LDLDIRECT in the last  72 hours. ------------------------------------------------------------------------------------------------------------------ No results for input(s): TSH, T4TOTAL, T3FREE, THYROIDAB in the last 72 hours.  Invalid input(s): FREET3 ------------------------------------------------------------------------------------------------------------------ No results for input(s): VITAMINB12, FOLATE, FERRITIN, TIBC, IRON, RETICCTPCT in the last 72 hours.  Coagulation profile No results for input(s): INR, PROTIME in the last 168 hours.  No results for input(s): DDIMER in the last 72 hours.  Cardiac Enzymes  Recent Labs Lab 10/23/14 1206 10/23/14 1743 10/23/14 2327  TROPONINI 0.05* 0.05* 0.04*   ------------------------------------------------------------------------------------------------------------------ Invalid input(s): POCBNP     Time Spent in minutes   25 minutes   Mahati Vajda M.D on 10/24/2014 at 11:58 AM  Between 7am to 7pm - Pager - [phone]  After 7pm go to [email or web address] - password [facility]  [facility]   Office  [phone]

## 2014-10-24 NOTE — Evaluation (Signed)
Physical Therapy Evaluation Patient Details Name: Holly Mckee MRN: [id number] DOB: December 21, 1919 Today's Date: 10/24/2014   History of Present Illness  81 yo female admitted with UTI. Hx of breast cancer, OA, osteopenia, HTN, blind R eye. Pt lives at home with her sister but is alone at night.   Clinical Impression  On eval, pt required Min assist for mobility-walked ~20 feet with RW. Demonstrates general weakness, decreased activity tolerance, and impaired gait and balance. Pt is confused-alert and oriented x1.  Do not feel pt is safe to d/c home at this time-decreased caregiver ability and 24 hour supervision/assist is not available. Recommend ST rehab at SNF to improve/maximize safety and independence with mobility.    Follow Up Recommendations SNF;Supervision/Assistance - 24 hour    Equipment Recommendations  None recommended by PT    Recommendations for Other Services OT consult     Precautions / Restrictions Precautions Precautions: Fall Restrictions Weight Bearing Restrictions: No      Mobility  Bed Mobility Overal bed mobility: Needs Assistance Bed Mobility: Supine to Sit     Supine to sit: Min assist     General bed mobility comments: Assist to get to EOB. Increased time.   Transfers Overall transfer level: Needs assistance Equipment used: Rolling walker (2 wheeled) Transfers: Sit to/from Stand Sit to Stand: Min assist         General transfer comment: Assist to rise, stabilize, control descent. VCs safety, technique, hand placement  Ambulation/Gait Ambulation/Gait assistance: Min assist Ambulation Distance (Feet): 20 Feet Assistive device: Rolling walker (2 wheeled) Gait Pattern/deviations: Step-through pattern;Decreased stride length;Trunk flexed     General Gait Details: Assist to stabilize and maneuver with RW. VCs safety. Fatigues easily.   Stairs            Wheelchair Mobility    Modified Rankin (Stroke Patients Only)        Balance Overall balance assessment: Needs assistance;History of Falls         Standing balance support: Bilateral upper extremity supported;During functional activity Standing balance-Leahy Scale: Poor                               Pertinent Vitals/Pain Pain Assessment: No/denies pain    Home Living Family/patient expects to be discharged to:: Private residence Living Arrangements: Other relatives;Children Available Help at Discharge: Family Type of Home: House Home Access: Stairs to enter;Ramped entrance   Entrance Stairs-Number of Steps: 2   Home Equipment: Clinical cytogeneticist - 4 wheels      Prior Function Level of Independence: Needs assistance   Gait / Transfers Assistance Needed: uses rollator at baseline-household  ADL's / Homemaking Assistance Needed: family having increased difficulty with bathing pt/getting into shower        Hand Dominance        Extremity/Trunk Assessment   Upper Extremity Assessment: Generalized weakness           Lower Extremity Assessment: Generalized weakness      Cervical / Trunk Assessment: Kyphotic  Communication   Communication: No difficulties  Cognition Arousal/Alertness: Awake/alert Behavior During Therapy: WFL for tasks assessed/performed Overall Cognitive Status: Impaired/Different from baseline Area of Impairment: Orientation;Problem solving;Safety/judgement Orientation Level: Disoriented to;Place;Time;Situation       Safety/Judgement: Decreased awareness of safety   Problem Solving: Requires tactile cues;Requires verbal cues;Slow processing      General Comments      Exercises        Assessment/Plan  PT Assessment Patient needs continued PT services  PT Diagnosis Difficulty walking;Generalized weakness;Altered mental status   PT Problem List Decreased strength;Decreased activity tolerance;Decreased balance;Decreased mobility;Decreased knowledge of use of DME;Pain;Decreased  cognition;Decreased safety awareness  PT Treatment Interventions DME instruction;Gait training;Functional mobility training;Therapeutic activities;Therapeutic exercise;Patient/family education;Balance training   PT Goals (Current goals can be found in the Care Plan section) Acute Rehab PT Goals Patient Stated Goal: home PT Goal Formulation: With family Time For Goal Achievement: 11/07/14 Potential to Achieve Goals: Good    Frequency Min 3X/week   Barriers to discharge        Co-evaluation               End of Session Equipment Utilized During Treatment: Gait belt Activity Tolerance: Patient tolerated treatment well Patient left: in chair;with call bell/phone within reach;with family/visitor present;with chair alarm set           Time: 1001-1026 PT Time Calculation (min) (ACUTE ONLY): 25 min   Charges:   PT Evaluation $Initial PT Evaluation Tier I: 1 Procedure PT Treatments $Gait Training: 8-22 mins   PT G Codes:        Weston Anna, MPT Pager: [phone]

## 2014-10-24 NOTE — Clinical Social Work Placement (Signed)
   CLINICAL SOCIAL WORK PLACEMENT  NOTE  Date:  10/24/2014  Patient Details  Name: Holly Mckee MRN: [id number] Date of Birth: September 02, 1919  Clinical Social Work is seeking post-discharge placement for this patient at the [place] level of care (*CSW will initial, date and re-position this form in  chart as items are completed):  Yes   Patient/family provided with [place] Work Department's list of facilities offering this level of care within the geographic area requested by the patient (or if unable, by the patient's family).  Yes   Patient/family informed of their freedom to choose among providers that offer the needed level of care, that participate in Medicare, Medicaid or managed care program needed by the patient, have an available bed and are willing to accept the patient.  Yes   Patient/family informed of [place]'s ownership interest in [facility] and [facility], as well as of the fact that they are under no obligation to receive care at these facilities.  PASRR submitted to EDS on 10/24/14     PASRR number received on 10/24/14     Existing PASRR number confirmed on       FL2 transmitted to all facilities in geographic area requested by pt/family on 10/24/14     FL2 transmitted to all facilities within larger geographic area on       Patient informed that his/her managed care company has contracts with or will negotiate with certain facilities, including the following:        Yes   Patient/family informed of bed offers received.  Patient chooses bed at [facility]     Physician recommends and patient chooses bed at      Patient to be transferred to   on  .  Patient to be transferred to facility by       Patient family notified on   of transfer.  Name of family member notified:        PHYSICIAN Please sign FL2     Additional Comment:    _______________________________________________ Ladell Pier, LCSW 10/24/2014, 1:29 PM

## 2014-10-24 NOTE — Clinical Social Work Placement (Signed)
   CLINICAL SOCIAL WORK PLACEMENT  NOTE  Date:  10/24/2014  Patient Details  Name: Holly Mckee MRN: [id number] Date of Birth: February 26, 1920  Clinical Social Work is seeking post-discharge placement for this patient at the [place] level of care (*CSW will initial, date and re-position this form in  chart as items are completed):  Yes   Patient/family provided with [place] Work Department's list of facilities offering this level of care within the geographic area requested by the patient (or if unable, by the patient's family).  Yes   Patient/family informed of their freedom to choose among providers that offer the needed level of care, that participate in Medicare, Medicaid or managed care program needed by the patient, have an available bed and are willing to accept the patient.  Yes   Patient/family informed of [place]'s ownership interest in [facility] and [facility], as well as of the fact that they are under no obligation to receive care at these facilities.  PASRR submitted to EDS on 10/24/14     PASRR number received on 10/24/14     Existing PASRR number confirmed on       FL2 transmitted to all facilities in geographic area requested by pt/family on 10/24/14     FL2 transmitted to all facilities within larger geographic area on       Patient informed that his/her managed care company has contracts with or will negotiate with certain facilities, including the following:            Patient/family informed of bed offers received.  Patient chooses bed at       Physician recommends and patient chooses bed at      Patient to be transferred to   on  .  Patient to be transferred to facility by       Patient family notified on   of transfer.  Name of family member notified:        PHYSICIAN Please sign FL2     Additional Comment:    _______________________________________________ Ladell Pier, LCSW 10/24/2014,  11:18 AM

## 2014-10-24 NOTE — Clinical Social Work Note (Addendum)
Clinical Social Work Assessment  Patient Details  Name: Holly Mckee MRN: [id number] Date of Birth: 10/13/1919  Date of referral:  10/24/14               Reason for consult:  Discharge Planning                Permission sought to share information with:  Family Supports Permission granted to share information::  Yes, Verbal Permission Granted (pt son at bedside)  Name::     Holly Mckee  Agency::     Relationship::  son  Contact Information:  [phone]  Housing/Transportation Living arrangements for the past 2 months:  Single Family Home Source of Information:  Patient, Adult Children Patient Interpreter Needed:  None Criminal Activity/Legal Involvement Pertinent to Current Situation/Hospitalization:  No - Comment as needed Significant Relationships:  Adult Children, Siblings Lives with:  Siblings Do you feel safe going back to the place where you live?  Yes (pt states she feels she could manage at home, but pt son does not feel pt can safely return home) Need for family participation in patient care:  Yes (Comment)  Care giving concerns:  Pt lives with elderly sister. Pt hopeful to return home, but pt son states that pt not safe to return home given pt current ADL needs.    Social Worker assessment / plan:  CSW received referral for New SNF.   CSW visited pt at bedside. Pt son present at this time. CSW introduced self and explained role. Pt reports that she lives at home with pt sister. Pt son reports that pt has had recent falls at home and pt son injured himself in picking pt up following a fall recently. Pt reports that she has a lady to assist with cleaning every week, but does not have caregivers in the home. CSW discussed recommendation for short term rehab at SNF prior to returning home. Pt initially hesitant to short term rehab stating that she wants to "wait" and see how she does. CSW explained that if pt interested it would be important to explore options in order for  pt to have options to make a decision about rehab placement. Pt son encouraged pt to agree to initiation of SNF search for short term rehab options. CSW provided support to pt son as pt son discussed that pt cannot return home following hospitalization as pt would not be safe. Pt son states that pt sister has been in rehab in the past and pt son is hopeful that if Holly Mckee is available where pt sister went that pt will agree for short term rehab. Pt family also expressed interest in [place] as it is a convenient location for pt daughter and pt son.   CSW completed FL2 and initiated SNF search to [facility]. CSW contacted [facility] and notified facility of pt family interest in facility.  CSW to follow up with pt and pt family to provide SNF bed offers.   CSW to continue to follow to provide support and assist with pt discharge planning needs.   Employment status:  Retired Forensic scientist:  Medicare PT Recommendations:  [place] / Referral to community resources:  [place]  Patient/Family's Response to care:  Pt alert and oriented x 3. Pt has little insight into the safety concerns of pt returning home as pt has been modified independent at home until this admission and pt son encouraging pt to consider short term rehab. Pt agreeable to  explore options. Pt family supportive and actively involved in pt care, but are unable to provide 24 hour care for pt in the home.   Patient/Family's Understanding of and Emotional Response to Diagnosis, Current Treatment, and Prognosis:  Per pt son, pt son knowledgeable about pt diagnosis and treatment plan and states that MD stated that pt will likely be medically ready for d/c tomorrow. Pt displays poor insight into diagnosis and treatment plan. Per chart, pt not oriented to situation and PT note indicates pt only oriented to person during PT eval.  Emotional Assessment Appearance:  Appears younger  than stated age Attitude/Demeanor/Rapport:  Other (appropriate) Affect (typically observed):  Quiet, Guarded Orientation:  Oriented to Self, Oriented to Place, Oriented to  Time Alcohol / Substance use:  Not Applicable Psych involvement (Current and /or in the community):  No (Comment)  Discharge Needs  Concerns to be addressed:  Discharge Planning Concerns Readmission within the last 30 days:  No Current discharge risk:  Physical Impairment Barriers to Discharge:  Continued Medical Work up   Alison Murray A, LCSW 10/24/2014, 11:10 AM  [phone]

## 2014-10-24 NOTE — Progress Notes (Signed)
CSW continuing to follow.  CSW followed up with pt, pt son, and pt daughter at bedside to discuss SNF bed offers.   CSW provided SNF bed offers to pt and pt family. Pt family narrowed down options to [facility] and [facility]. Pt family shared that pt other sister passed away in [facility], so they are unsure if pt would feel comfortable going to facility. CSW checked on private room availability at [facility] per pt family request and [facility] has private room available. Pt and pt children discussed and chose [facility].   CSW contacted facility and confirmed private room availability for tomorrow. Pt son plans to complete paperwork this afternoon.   Per MD, anticipate discharge tomorrow.   CSW to continue to follow to provide support and facilitate pt discharge needs when pt medically ready for discharge.  Alison Murray, MSW, [place] Work [phone]

## 2014-10-25 DIAGNOSIS — N179 Acute kidney failure, unspecified: Secondary | ICD-10-CM

## 2014-10-25 LAB — BASIC METABOLIC PANEL
Anion gap: 5 (ref 5–15)
BUN: 17 mg/dL (ref 6–20)
CO2: 23 mmol/L (ref 22–32)
CREATININE: 0.93 mg/dL (ref 0.44–1.00)
Calcium: 9.5 mg/dL (ref 8.9–10.3)
Chloride: 112 mmol/L — ABNORMAL HIGH (ref 101–111)
GFR calc Af Amer: 60 mL/min — ABNORMAL LOW (ref >60)
GFR, EST NON AFRICAN AMERICAN: 52 mL/min — AB (ref >60)
GLUCOSE: 103 mg/dL — AB (ref 65–99)
Potassium: 4.4 mmol/L (ref 3.5–5.1)
SODIUM: 140 mmol/L (ref 135–145)

## 2014-10-25 LAB — URINE CULTURE: Colony Count: 95000

## 2014-10-25 LAB — CBC
HEMATOCRIT: 31.2 % — AB (ref 36.0–46.0)
Hemoglobin: 10 g/dL — ABNORMAL LOW (ref 12.0–15.0)
MCH: 30.1 pg (ref 26.0–34.0)
MCHC: 32.1 g/dL (ref 30.0–36.0)
MCV: 94 fL (ref 78.0–100.0)
PLATELETS: 132 10*3/uL — AB (ref 150–400)
RBC: 3.32 MIL/uL — ABNORMAL LOW (ref 3.87–5.11)
RDW: 14.1 % (ref 11.5–15.5)
WBC: 10.6 10*3/uL — ABNORMAL HIGH (ref 4.0–10.5)

## 2014-10-25 MED ORDER — FUROSEMIDE 20 MG PO TABS: 20.0000 mg | ORAL_TABLET | Freq: Every day | ORAL | Status: DC

## 2014-10-25 MED ORDER — OXYCODONE-ACETAMINOPHEN 5-325 MG PO TABS: 1.0000 | ORAL_TABLET | ORAL | Status: DC | PRN

## 2014-10-25 NOTE — Progress Notes (Signed)
Pt for discharge to [facility].  CSW facilitated pt discharge needs including contacting facility, faxing pt discharge information via [facility], discussing with pt, pt daughter, and pt son at bedside, providing RN phone number to call report, and arranging ambulance transport via [facility] for pt to [facility].   Pt daughter and pt son were able to tour [facility] and feel that pt will adjust well at facility for short term rehab. Pt family appreciative of CSW support and assistance.  No further social work needs identified at this time.  CSW signing off.   Alison Murray, MSW, [place] Work [phone]

## 2014-10-25 NOTE — Plan of Care (Signed)
Problem: Phase III Progression Outcomes Goal: Discharge plan remains appropriate-arrangements made Outcome: Completed/Met Date Met:  10/25/14 Patient d/c today

## 2014-10-25 NOTE — Discharge Instructions (Signed)
Follow with Primary MD Delia Chimes, NP in 7 days   Get CBC, CMP, 2 view Chest X ray checked  by Primary MD next visit.    Activity: As tolerated with Full fall precautions use walker/cane & assistance as needed   Disposition SNF   Diet: Regular , with feeding assistance and aspiration precautions.  For Heart failure patients - Check your Weight same time everyday, if you gain over 2 pounds, or you develop in leg swelling, experience more shortness of breath or chest pain, call your Primary MD immediately. Follow Cardiac Low Salt Diet and 1.5 lit/day fluid restriction.   On your next visit with your primary care physician please Get Medicines reviewed and adjusted.   Please request your Prim.MD to go over all Hospital Tests and Procedure/Radiological results at the follow up, please get all Hospital records sent to your Prim MD by signing hospital release before you go home.   If you experience worsening of your admission symptoms, develop shortness of breath, life threatening emergency, suicidal or homicidal thoughts you must seek medical attention immediately by calling 911 or calling your MD immediately  if symptoms less severe.  You Must read complete instructions/literature along with all the possible adverse reactions/side effects for all the Medicines you take and that have been prescribed to you. Take any new Medicines after you have completely understood and accpet all the possible adverse reactions/side effects.   Do not drive, operating heavy machinery, perform activities at heights, swimming or participation in water activities or provide baby sitting services if your were admitted for syncope or siezures until you have seen by Primary MD or a Neurologist and advised to do so again.  Do not drive when taking Pain medications.    Do not take more than prescribed Pain, Sleep and Anxiety Medications  Special Instructions: If you have smoked or chewed Tobacco  in the last 2  yrs please stop smoking, stop any regular Alcohol  and or any Recreational drug use.  Wear Seat belts while driving.   Please note  You were cared for by a hospitalist during your hospital stay. If you have any questions about your discharge medications or the care you received while you were in the hospital after you are discharged, you can call the unit and asked to speak with the hospitalist on call if the hospitalist that took care of you is not available. Once you are discharged, your primary care physician will handle any further medical issues. Please note that NO REFILLS for any discharge medications will be authorized once you are discharged, as it is imperative that you return to your primary care physician (or establish a relationship with a primary care physician if you do not have one) for your aftercare needs so that they can reassess your need for medications and monitor your lab values.

## 2014-10-25 NOTE — Discharge Summary (Signed)
Holly Mckee, is a 81 y.o. female  DOB [birth year]  MRN [id number].  Admission date:  10/22/2014  Admitting Physician  Truett Mainland, DO  Discharge Date:  10/25/2014   Primary MD  Delia Chimes, NP  Recommendations for primary care physician for things to follow:  - Check labs including CBC, BMP during next visit   Admission Diagnosis  Hypoxia [R09.02] Elevated troponin I level [R79.89] Urinary tract infection without hematuria, site unspecified [N39.0]   Discharge Diagnosis  Hypoxia [R09.02] Elevated troponin I level [R79.89] Urinary tract infection without hematuria, site unspecified [N39.0]    Active Problems:   Urinary tract infection   Hypokalemia   Acute renal injury   UTI (lower urinary tract infection)   Elevated troponin      Past Medical History  Diagnosis Date  . Breast cancer   . Osteoarthritis   . Osteopenia   . Hypertension   . Blind right eye     Past Surgical History  Procedure Laterality Date  . Total hip arthroplasty      2  . Back surgery    . Breast lumpectomy    . Abdominal hysterectomy         History of present illness and  Hospital Course:     Kindly see H&P for history of present illness and admission details, please review complete Labs, Consult reports and Test reports for all details in brief  HPI  from the history and physical done on the day of admission 10/22/14  Holly Mckee is a 81 y.o. female  with a history of hypertension, breast cancer approximately 4 years ago, osteoarthritis, osteopenia , hypothyroidism who was brought to the emergency department due to complaints of fevers and chills. This started earlier today, with no palliating or provoking factors. She also complains of generalized malaise over the past week which is increased.  Hospital Course   Weakness - most likely related to dehydration and UTI,SNF d/c for  PT.[facility]  UTI - Treated with IV Rocephin total of 3 days  - Urine culture growing gram-negative rods - Afebrile, leukocytosis normalized.  Renal failure: -unclear base line , creatinine is 0.93 at day of discharge  Elevated troponins - Denies any chest pain, denies any shortness of breath,this is most likely related to dehydration, infection,  - Troponins trending down 0.10>>0.05>>0.05>>0.04 - 2-D echo showing EF 50% , with mild lateral hypokinesis , discussed with cardiology , no further workup indicated at this point in the absence of ischemic symptoms or acute CHF .  Hypokalemia - Repleted, and test him his 4.4 on discharge  Hypothyroidism - Continue with Synthroid.  Hypertension - Continue with Norvasc   Discharge Condition: Stable   Follow UP  Follow-up Information    Follow up with [facility], NP. Schedule an appointment as soon as possible for a visit in 1 week.   Specialty:  Nurse Practitioner   Why:  post hospitalization  follow-up   Contact information:   [address] [place] [address] [phone]  Discharge Instructions  and  Discharge Medications     Discharge Instructions    Discharge instructions    Complete by:  As directed   Follow with Primary MD Delia Chimes, NP in 7 days   Get CBC, CMP, 2 view Chest X ray checked  by Primary MD next visit.    Activity: As tolerated with Full fall precautions use walker/cane & assistance as needed   Disposition SNF   Diet: Regular , with feeding assistance and aspiration precautions.  For Heart failure patients - Check your Weight same time everyday, if you gain over 2 pounds, or you develop in leg swelling, experience more shortness of breath or chest pain, call your Primary MD immediately. Follow Cardiac Low Salt Diet and 1.5 lit/day fluid restriction.   On your next visit with your primary care physician please Get Medicines reviewed and adjusted.   Please request your  Prim.MD to go over all Hospital Tests and Procedure/Radiological results at the follow up, please get all Hospital records sent to your Prim MD by signing hospital release before you go home.   If you experience worsening of your admission symptoms, develop shortness of breath, life threatening emergency, suicidal or homicidal thoughts you must seek medical attention immediately by calling 911 or calling your MD immediately  if symptoms less severe.  You Must read complete instructions/literature along with all the possible adverse reactions/side effects for all the Medicines you take and that have been prescribed to you. Take any new Medicines after you have completely understood and accpet all the possible adverse reactions/side effects.   Do not drive, operating heavy machinery, perform activities at heights, swimming or participation in water activities or provide baby sitting services if your were admitted for syncope or siezures until you have seen by Primary MD or a Neurologist and advised to do so again.  Do not drive when taking Pain medications.    Do not take more than prescribed Pain, Sleep and Anxiety Medications  Special Instructions: If you have smoked or chewed Tobacco  in the last 2 yrs please stop smoking, stop any regular Alcohol  and or any Recreational drug use.  Wear Seat belts while driving.   Please note  You were cared for by a hospitalist during your hospital stay. If you have any questions about your discharge medications or the care you received while you were in the hospital after you are discharged, you can call the unit and asked to speak with the hospitalist on call if the hospitalist that took care of you is not available. Once you are discharged, your primary care physician will handle any further medical issues. Please note that NO REFILLS for any discharge medications will be authorized once you are discharged, as it is imperative that you return to your  primary care physician (or establish a relationship with a primary care physician if you do not have one) for your aftercare needs so that they can reassess your need for medications and monitor your lab values.     Increase activity slowly    Complete by:  As directed             Medication List    TAKE these medications        amLODipine 5 MG tablet  Commonly known as:  NORVASC  Take 5 mg by mouth daily.     celecoxib 200 MG capsule  Commonly known as:  CELEBREX  Take 200 mg by mouth daily.  citalopram 40 MG tablet  Commonly known as:  CELEXA  Take 40 mg by mouth daily.     cyclopentolate 1 % ophthalmic solution  Commonly known as:  CYCLODRYL,CYCLOGYL  Place 1 drop into the right eye 2 (two) times daily.     furosemide 20 MG tablet  Commonly known as:  LASIX  Take 1 tablet (20 mg total) by mouth daily.  Start taking on:  10/27/2014     levothyroxine 25 MCG tablet  Commonly known as:  SYNTHROID, LEVOTHROID  Take 1 tablet (25 mcg total) by mouth daily.     oxyCODONE-acetaminophen 5-325 MG per tablet  Commonly known as:  PERCOCET/ROXICET  Take 1 tablet by mouth every 4 (four) hours as needed for moderate pain or severe pain.     prednisoLONE acetate 1 % ophthalmic suspension  Commonly known as:  PRED FORTE  Place 1 drop into the right eye 2 (two) times daily.     ranitidine 75 MG tablet  Commonly known as:  ZANTAC  Take 75 mg by mouth 2 (two) times daily.     timolol 0.5 % ophthalmic solution  Commonly known as:  TIMOPTIC  Place 1 drop into the left eye 2 (two) times daily.     TRAVATAN Z 0.004 % Soln ophthalmic solution  Generic drug:  Travoprost (BAK Free)  Place 1 drop into the left eye at bedtime.     Vitamin D 2000 UNITS Caps  Take by mouth.          Diet and Activity recommendation: See Discharge Instructions above   Consults obtained - none   Major procedures and Radiology Reports - PLEASE review detailed and final reports for all  details, in brief -     Dg Chest 2 View  10/22/2014   CLINICAL DATA:  81 year old with intermittent fever for the past 24 hours.  EXAM: CHEST  2 VIEW  COMPARISON:  Prior chest x-ray 08/06/2004  FINDINGS: Cardiac and mediastinal contours remain unchanged. Ectatic atherosclerotic thoracic aorta. Calcification of the mitral valve annulus. The annulus appears dilated. Marked elevation of the right hemidiaphragm with colonic interposition again noted. Slightly right lower lobe atelectasis. Pulmonary vascular congestion without overt edema. No definite focal airspace consolidation. No pleural or pneumothorax. Surgical clips present in the right axilla. Acute osseous abnormality  IMPRESSION: 1. Pulmonary vascular congestion without overt edema. 2. Calcified and likely dilated mitral valve annulus. 3. Tortuous and atherosclerotic thoracic aorta. 4. Marked elevation of the right again noted with associated right middle lobe atelectasis.   Electronically Signed   By: Jacqulynn Cadet M.D.   On: 10/22/2014 21:30    Micro Results     Recent Results (from the past 240 hour(s))  Urine culture     Status: None (Preliminary result)   Collection Time: 10/22/14  9:12 PM  Result Value Ref Range Status   Specimen Description URINE, CATHETERIZED  Final   Special Requests NONE  Final   Colony Count   Final    95,000 COLONIES/ML Performed at [facility]    Culture   Final    [place] Performed at [facility]    Report Status PENDING  Incomplete       Today   Subjective:   Holly Mckee today has no headache,no chest abdominal pain,no new weakness tingling or numbness, feels much better wants to go home today.  Objective:   Blood pressure 130/94, pulse 68, temperature 98.3 F (36.8 C), temperature source Oral,  resp. rate 20, height 5\' 6"  (1.676 m), weight 66.67 kg (146 lb 15.7 oz), SpO2 94 %.   Intake/Output Summary (Last 24 hours) at 10/25/14 0913 Last data filed at  10/25/14 0847  Gross per 24 hour  Intake    650 ml  Output   1400 ml  Net   -750 ml    Exam Awake Alert, Oriented , No new F.N deficits, Normal affect [place], right eye chronic changes. Supple Neck,No JVD, No cervical lymphadenopathy appriciated.  Symmetrical Chest wall movement, Good air movement bilaterally,  RRR,No Gallops,Rubs or new Murmurs, No Parasternal Heave +ve B.Sounds, Abd Soft, No tenderness, No organomegaly appriciated, No rebound - guarding or rigidity. No Cyanosis, Clubbing or edema, No new Rash or bruise   Data Review   CBC w Diff: Lab Results  Component Value Date   WBC 10.6* 10/25/2014   HGB 10.0* 10/25/2014   HCT 31.2* 10/25/2014   PLT 132* 10/25/2014   LYMPHOPCT 3* 10/22/2014   MONOPCT 2* 10/22/2014   EOSPCT 0 10/22/2014   BASOPCT 0 10/22/2014    CMP: Lab Results  Component Value Date   NA 140 10/25/2014   K 4.4 10/25/2014   CL 112* 10/25/2014   CO2 23 10/25/2014   BUN 17 10/25/2014   CREATININE 0.93 10/25/2014   PROT 6.2* 10/22/2014   ALBUMIN 3.3* 10/22/2014   BILITOT 1.3* 10/22/2014   ALKPHOS 117 10/22/2014   AST 29 10/22/2014   ALT 22 10/22/2014  .   Total Time in preparing paper work, data evaluation and todays exam - 35 minutes  ELGERGAWY, DAWOOD M.D on 10/25/2014 at 9:13 AM  [facility]   Office  [phone]

## 2014-10-25 NOTE — Clinical Social Work Placement (Signed)
   CLINICAL SOCIAL WORK PLACEMENT  NOTE  Date:  10/25/2014  Patient Details  Name: Holly Mckee MRN: [id number] Date of Birth: November 05, 1919  Clinical Social Work is seeking post-discharge placement for this patient at the [place] level of care (*CSW will initial, date and re-position this form in  chart as items are completed):  Yes   Patient/family provided with [facility] Work Department's list of facilities offering this level of care within the geographic area requested by the patient (or if unable, by the patient's family).  Yes   Patient/family informed of their freedom to choose among providers that offer the needed level of care, that participate in Medicare, Medicaid or managed care program needed by the patient, have an available bed and are willing to accept the patient.  Yes   Patient/family informed of [facility]'s ownership interest in [facility] and [facility], as well as of the fact that they are under no obligation to receive care at these facilities.  PASRR submitted to EDS on 10/24/14     PASRR number received on 10/24/14     Existing PASRR number confirmed on       FL2 transmitted to all facilities in geographic area requested by pt/family on 10/24/14     FL2 transmitted to all facilities within larger geographic area on       Patient informed that his/her managed care company has contracts with or will negotiate with certain facilities, including the following:        Yes   Patient/family informed of bed offers received.  Patient chooses bed at [facility]     Physician recommends and patient chooses bed at      Patient to be transferred to [facility] on 10/25/14.  Patient to be transferred to facility by ambulance [name])     Patient family notified on 10/25/14 of transfer.  Name of family member notified:  pt daughter, [name]     PHYSICIAN Please sign FL2     Additional  Comment:    _______________________________________________ Ladell Pier, LCSW 10/25/2014, 10:22 AM

## 2014-12-08 ENCOUNTER — Emergency Department (HOSPITAL_COMMUNITY): Payer: MEDICARE

## 2014-12-08 ENCOUNTER — Inpatient Hospital Stay (HOSPITAL_COMMUNITY)
Admission: EM | Admit: 2014-12-08 | Discharge: 2014-12-11 | DRG: 690 | Disposition: A | Discharge status: Skilled Nursing Facility | Payer: MEDICARE | Attending: Internal Medicine | Admitting: Internal Medicine

## 2014-12-08 ENCOUNTER — Encounter (HOSPITAL_COMMUNITY): Payer: Self-pay

## 2014-12-08 DIAGNOSIS — D72829 Elevated white blood cell count, unspecified: Secondary | ICD-10-CM | POA: Diagnosis not present

## 2014-12-08 DIAGNOSIS — R531 Weakness: Secondary | ICD-10-CM | POA: Diagnosis not present

## 2014-12-08 DIAGNOSIS — R300 Dysuria: Secondary | ICD-10-CM | POA: Diagnosis not present

## 2014-12-08 DIAGNOSIS — Z8249 Family history of ischemic heart disease and other diseases of the circulatory system: Secondary | ICD-10-CM

## 2014-12-08 DIAGNOSIS — I10 Essential (primary) hypertension: Secondary | ICD-10-CM | POA: Diagnosis not present

## 2014-12-08 DIAGNOSIS — H5441 Blindness, right eye, normal vision left eye: Secondary | ICD-10-CM | POA: Diagnosis present

## 2014-12-08 DIAGNOSIS — N179 Acute kidney failure, unspecified: Secondary | ICD-10-CM | POA: Diagnosis not present

## 2014-12-08 DIAGNOSIS — R600 Localized edema: Secondary | ICD-10-CM | POA: Diagnosis present

## 2014-12-08 DIAGNOSIS — B9620 Unspecified Escherichia coli [E. coli] as the cause of diseases classified elsewhere: Secondary | ICD-10-CM | POA: Diagnosis present

## 2014-12-08 DIAGNOSIS — M8580 Other specified disorders of bone density and structure, unspecified site: Secondary | ICD-10-CM | POA: Diagnosis present

## 2014-12-08 DIAGNOSIS — Z96649 Presence of unspecified artificial hip joint: Secondary | ICD-10-CM | POA: Diagnosis present

## 2014-12-08 DIAGNOSIS — R627 Adult failure to thrive: Secondary | ICD-10-CM | POA: Diagnosis present

## 2014-12-08 DIAGNOSIS — N390 Urinary tract infection, site not specified: Secondary | ICD-10-CM | POA: Diagnosis not present

## 2014-12-08 DIAGNOSIS — D649 Anemia, unspecified: Secondary | ICD-10-CM | POA: Diagnosis not present

## 2014-12-08 DIAGNOSIS — M1990 Unspecified osteoarthritis, unspecified site: Secondary | ICD-10-CM | POA: Diagnosis present

## 2014-12-08 DIAGNOSIS — Z853 Personal history of malignant neoplasm of breast: Secondary | ICD-10-CM

## 2014-12-08 DIAGNOSIS — E039 Hypothyroidism, unspecified: Secondary | ICD-10-CM | POA: Diagnosis present

## 2014-12-08 DIAGNOSIS — D638 Anemia in other chronic diseases classified elsewhere: Secondary | ICD-10-CM | POA: Diagnosis present

## 2014-12-08 DIAGNOSIS — T501X5A Adverse effect of loop [high-ceiling] diuretics, initial encounter: Secondary | ICD-10-CM | POA: Diagnosis present

## 2014-12-08 DIAGNOSIS — I959 Hypotension, unspecified: Secondary | ICD-10-CM | POA: Diagnosis present

## 2014-12-08 DIAGNOSIS — E860 Dehydration: Secondary | ICD-10-CM | POA: Diagnosis present

## 2014-12-08 DIAGNOSIS — E876 Hypokalemia: Secondary | ICD-10-CM | POA: Diagnosis present

## 2014-12-08 DIAGNOSIS — R5383 Other fatigue: Secondary | ICD-10-CM

## 2014-12-08 DIAGNOSIS — Z87440 Personal history of urinary (tract) infections: Secondary | ICD-10-CM | POA: Diagnosis not present

## 2014-12-08 DIAGNOSIS — M7989 Other specified soft tissue disorders: Secondary | ICD-10-CM

## 2014-12-08 DIAGNOSIS — Z79899 Other long term (current) drug therapy: Secondary | ICD-10-CM | POA: Diagnosis not present

## 2014-12-08 LAB — COMPREHENSIVE METABOLIC PANEL
ALT: 9 U/L — ABNORMAL LOW (ref 14–54)
ANION GAP: 10 (ref 5–15)
AST: 14 U/L — ABNORMAL LOW (ref 15–41)
Albumin: 2.7 g/dL — ABNORMAL LOW (ref 3.5–5.0)
Alkaline Phosphatase: 77 U/L (ref 38–126)
BUN: 56 mg/dL — ABNORMAL HIGH (ref 6–20)
CALCIUM: 9.2 mg/dL (ref 8.9–10.3)
CHLORIDE: 101 mmol/L (ref 101–111)
CO2: 23 mmol/L (ref 22–32)
CREATININE: 2.55 mg/dL — AB (ref 0.44–1.00)
GFR calc Af Amer: 18 mL/min — ABNORMAL LOW (ref >60)
GFR, EST NON AFRICAN AMERICAN: 15 mL/min — AB (ref >60)
Glucose, Bld: 111 mg/dL — ABNORMAL HIGH (ref 65–99)
Potassium: 4 mmol/L (ref 3.5–5.1)
Sodium: 134 mmol/L — ABNORMAL LOW (ref 135–145)
Total Bilirubin: 0.5 mg/dL (ref 0.3–1.2)
Total Protein: 5.8 g/dL — ABNORMAL LOW (ref 6.5–8.1)

## 2014-12-08 LAB — URINALYSIS, ROUTINE W REFLEX MICROSCOPIC
Bilirubin Urine: NEGATIVE
GLUCOSE, UA: NEGATIVE mg/dL
KETONES UR: NEGATIVE mg/dL
Nitrite: NEGATIVE
PROTEIN: NEGATIVE mg/dL
Specific Gravity, Urine: 1.012 (ref 1.005–1.030)
UROBILINOGEN UA: 1 mg/dL (ref 0.0–1.0)
pH: 5 (ref 5.0–8.0)

## 2014-12-08 LAB — CBC WITH DIFFERENTIAL/PLATELET
BASOS ABS: 0 10*3/uL (ref 0.0–0.1)
Basophils Relative: 0 % (ref 0–1)
EOS PCT: 0 % (ref 0–5)
Eosinophils Absolute: 0 10*3/uL (ref 0.0–0.7)
HCT: 31.2 % — ABNORMAL LOW (ref 36.0–46.0)
Hemoglobin: 10.1 g/dL — ABNORMAL LOW (ref 12.0–15.0)
LYMPHS ABS: 1 10*3/uL (ref 0.7–4.0)
LYMPHS PCT: 6 % — AB (ref 12–46)
MCH: 30.1 pg (ref 26.0–34.0)
MCHC: 32.4 g/dL (ref 30.0–36.0)
MCV: 93.1 fL (ref 78.0–100.0)
Monocytes Absolute: 1.6 10*3/uL — ABNORMAL HIGH (ref 0.1–1.0)
Monocytes Relative: 10 % (ref 3–12)
NEUTROS ABS: 14.5 10*3/uL — AB (ref 1.7–7.7)
Neutrophils Relative %: 84 % — ABNORMAL HIGH (ref 43–77)
PLATELETS: 187 10*3/uL (ref 150–400)
RBC: 3.35 MIL/uL — ABNORMAL LOW (ref 3.87–5.11)
RDW: 13.7 % (ref 11.5–15.5)
WBC: 17.1 10*3/uL — ABNORMAL HIGH (ref 4.0–10.5)

## 2014-12-08 LAB — URINE MICROSCOPIC-ADD ON

## 2014-12-08 LAB — TROPONIN I

## 2014-12-08 LAB — I-STAT CG4 LACTIC ACID, ED: LACTIC ACID, VENOUS: 0.94 mmol/L (ref 0.5–2.0)

## 2014-12-08 LAB — TSH: TSH: 1.431 u[IU]/mL (ref 0.350–4.500)

## 2014-12-08 MED ORDER — ACETAMINOPHEN 650 MG RE SUPP: 650.0000 mg | Freq: Four times a day (QID) | RECTAL | Status: DC | PRN

## 2014-12-08 MED ORDER — ONDANSETRON HCL 4 MG PO TABS: 4.0000 mg | ORAL_TABLET | Freq: Four times a day (QID) | ORAL | Status: DC | PRN

## 2014-12-08 MED ORDER — BISACODYL 10 MG RE SUPP: 10.0000 mg | Freq: Every day | RECTAL | Status: DC | PRN

## 2014-12-08 MED ORDER — PREDNISOLONE ACETATE 1 % OP SUSP
1.0000 [drp] | Freq: Two times a day (BID) | OPHTHALMIC | Status: DC
  Administered 2014-12-08 – 2014-12-11 (×6): 1 [drp] via OPHTHALMIC
  Filled 2014-12-08: qty 1

## 2014-12-08 MED ORDER — SODIUM CHLORIDE 0.9 % IJ SOLN
3.0000 mL | Freq: Two times a day (BID) | INTRAMUSCULAR | Status: DC
  Administered 2014-12-09: 3 mL via INTRAVENOUS

## 2014-12-08 MED ORDER — POLYETHYLENE GLYCOL 3350 17 G PO PACK: 17.0000 g | PACK | Freq: Every day | ORAL | Status: DC | PRN

## 2014-12-08 MED ORDER — ACETAMINOPHEN 325 MG PO TABS
650.0000 mg | ORAL_TABLET | Freq: Four times a day (QID) | ORAL | Status: DC | PRN
  Administered 2014-12-09: 650 mg via ORAL
  Filled 2014-12-08: qty 2

## 2014-12-08 MED ORDER — LEVOTHYROXINE SODIUM 25 MCG PO TABS
25.0000 ug | ORAL_TABLET | Freq: Every day | ORAL | Status: DC
Start: 2014-12-09 — End: 2014-12-11
  Administered 2014-12-09 – 2014-12-11 (×3): 25 ug via ORAL
  Filled 2014-12-08 (×3): qty 1

## 2014-12-08 MED ORDER — CYCLOPENTOLATE HCL 1 % OP SOLN
1.0000 [drp] | Freq: Two times a day (BID) | OPHTHALMIC | Status: DC
  Administered 2014-12-08 – 2014-12-11 (×6): 1 [drp] via OPHTHALMIC
  Filled 2014-12-08: qty 2

## 2014-12-08 MED ORDER — LATANOPROST 0.005 % OP SOLN
1.0000 [drp] | Freq: Every day | OPHTHALMIC | Status: DC
  Administered 2014-12-08 – 2014-12-10 (×3): 1 [drp] via OPHTHALMIC
  Filled 2014-12-08 (×2): qty 2.5

## 2014-12-08 MED ORDER — CEFTRIAXONE SODIUM 1 G IJ SOLR
1.0000 g | INTRAMUSCULAR | Status: DC
  Administered 2014-12-08 – 2014-12-10 (×3): 1 g via INTRAVENOUS
  Filled 2014-12-08 (×4): qty 10

## 2014-12-08 MED ORDER — TIMOLOL MALEATE 0.5 % OP SOLN
1.0000 [drp] | Freq: Two times a day (BID) | OPHTHALMIC | Status: DC
  Administered 2014-12-08 – 2014-12-11 (×6): 1 [drp] via OPHTHALMIC
  Filled 2014-12-08: qty 5

## 2014-12-08 MED ORDER — ONDANSETRON HCL 4 MG/2ML IJ SOLN: 4.0000 mg | Freq: Four times a day (QID) | INTRAMUSCULAR | Status: DC | PRN

## 2014-12-08 MED ORDER — DOCUSATE SODIUM 100 MG PO CAPS
100.0000 mg | ORAL_CAPSULE | Freq: Two times a day (BID) | ORAL | Status: DC
  Administered 2014-12-08 – 2014-12-10 (×5): 100 mg via ORAL
  Filled 2014-12-08 (×5): qty 1

## 2014-12-08 MED ORDER — ENOXAPARIN SODIUM 30 MG/0.3ML ~~LOC~~ SOLN
30.0000 mg | SUBCUTANEOUS | Status: DC
  Administered 2014-12-08 – 2014-12-10 (×3): 30 mg via SUBCUTANEOUS
  Filled 2014-12-08 (×3): qty 0.3

## 2014-12-08 MED ORDER — OXYCODONE-ACETAMINOPHEN 5-325 MG PO TABS
1.0000 | ORAL_TABLET | ORAL | Status: DC | PRN
  Administered 2014-12-10 (×2): 1 via ORAL
  Filled 2014-12-08 (×2): qty 1

## 2014-12-08 MED ORDER — FAMOTIDINE 20 MG PO TABS
20.0000 mg | ORAL_TABLET | ORAL | Status: DC
  Administered 2014-12-10: 20 mg via ORAL
  Filled 2014-12-08: qty 1

## 2014-12-08 MED ORDER — SODIUM CHLORIDE 0.9 % IV SOLN
INTRAVENOUS | Status: DC
  Administered 2014-12-08 – 2014-12-09 (×4): via INTRAVENOUS

## 2014-12-08 NOTE — ED Notes (Signed)
Bed: QP61 Expected date:  Expected time:  Means of arrival:  Comments: 81 y/o F, hypotensive

## 2014-12-08 NOTE — ED Provider Notes (Signed)
CSN: [id number]     Arrival date & time 12/08/14  1415 History   First MD Initiated Contact with Patient 12/08/14 1435     Chief Complaint  Patient presents with  . Fatigue     (Consider location/radiation/quality/duration/timing/severity/associated sxs/prior Treatment) The history is provided by the patient and a relative.    81 year old female with a past medical history of hypertension and cancer status post lobectomy presents to the ED with her daughter secondary to fatigue. Patient had recently been admitted to the hospital approximately 6 weeks ago with a UTI and associated hypotension. Daughter says that she started acting some more sleepy than normal Friday she should've the hospital that she would not go. She progressively worsened over the last 2 days. No nausea vomiting cough diaphoresis pain rashes or other symptoms during that time just progressively worsening sleepiness. Took her to urgent care where she reportedly had some low blood pressures in the 80s over 40s. MS was called and brought her here for further evaluation. Most of history is given by daughter as patient is slightly altered.  Past Medical History  Diagnosis Date  . Breast cancer   . Osteoarthritis   . Osteopenia   . Hypertension   . Blind right eye    Past Surgical History  Procedure Laterality Date  . Total hip arthroplasty      2  . Back surgery    . Breast lumpectomy    . Abdominal hysterectomy     Family History  Problem Relation Age of Onset  . Hypertension Father   . Hypertension Sister    History  Substance Use Topics  . Smoking status: Never Smoker   . Smokeless tobacco: Not on file  . Alcohol Use: No   OB History    No data available     Review of Systems  Unable to perform ROS: Mental status change  Constitutional: Positive for fatigue. Negative for fever and chills.  HENT: Negative for ear pain.   Respiratory: Negative for cough.   Gastrointestinal: Negative for nausea and  abdominal pain.  Genitourinary: Negative for decreased urine volume.  Musculoskeletal: Negative for back pain.      Allergies  Benazepril and Norvasc  Home Medications   Prior to Admission medications   Medication Sig Start Date End Date Taking? Authorizing Provider  amLODipine (NORVASC) 5 MG tablet Take 5 mg by mouth daily.     Yes Historical Provider, MD  celecoxib (CELEBREX) 200 MG capsule Take 200 mg by mouth daily.    Yes Historical Provider, MD  Cholecalciferol (VITAMIN D) 2000 UNITS CAPS Take by mouth.     Yes Historical Provider, MD  citalopram (CELEXA) 40 MG tablet Take 40 mg by mouth daily.   Yes Historical Provider, MD  cyclopentolate (CYCLODRYL,CYCLOGYL) 1 % ophthalmic solution Place 1 drop into the right eye 2 (two) times daily. 09/20/14  Yes Historical Provider, MD  furosemide (LASIX) 20 MG tablet Take 1 tablet (20 mg total) by mouth daily. 10/27/14  Yes Albertine Patricia, MD  levothyroxine (SYNTHROID, LEVOTHROID) 25 MCG tablet Take 1 tablet (25 mcg total) by mouth daily. 08/14/12  Yes Orlena Sheldon, PA-C  oxyCODONE-acetaminophen (PERCOCET/ROXICET) 5-325 MG per tablet Take 1 tablet by mouth every 4 (four) hours as needed for moderate pain or severe pain. 10/25/14  Yes Albertine Patricia, MD  prednisoLONE acetate (PRED FORTE) 1 % ophthalmic suspension Place 1 drop into the right eye 2 (two) times daily. 10/02/14  Yes Historical Provider,  MD  ranitidine (ZANTAC) 75 MG tablet Take 75 mg by mouth 2 (two) times daily.     Yes Historical Provider, MD  timolol (TIMOPTIC) 0.5 % ophthalmic solution Place 1 drop into the left eye 2 (two) times daily. 08/07/14  Yes Historical Provider, MD  TRAVATAN Z 0.004 % SOLN ophthalmic solution Place 1 drop into the left eye at bedtime. 09/23/14  Yes Historical Provider, MD   BP 110/40 mmHg  Pulse 81  Temp(Src) 98.2 F (36.8 C) (Oral)  Resp 20  Ht 5\' 3"  (1.6 m)  Wt 137 lb 3.2 oz (62.234 kg)  BMI 24.31 kg/m2  SpO2 90% Physical Exam  Constitutional:  She appears well-developed and well-nourished. No distress.  HENT:  Head: Normocephalic and atraumatic.  Eyes:  Right eye sunken, gray.   Cardiovascular: Normal rate and regular rhythm.   Pulmonary/Chest: Effort normal. No respiratory distress. She has no wheezes. She has no rales.  Abdominal: Soft. She exhibits no distension. There is no tenderness.  Musculoskeletal: Normal range of motion.  Neurological: She is alert.  Alert but sleepy on exam  Skin: Skin is warm and dry.  Nursing note and vitals reviewed.   ED Course  Procedures (including critical care time)  CRITICAL CARE Performed by: Merrily Pew   Total critical care time: 30 minutes  Critical care time was exclusive of separately billable procedures and treating other patients.  Critical care was necessary to treat or prevent imminent or life-threatening deterioration.  Critical care was time spent personally by me on the following activities: development of treatment plan with patient and/or surrogate as well as nursing, discussions with consultants, evaluation of patient's response to treatment, examination of patient, obtaining history from patient or surrogate, ordering and performing treatments and interventions, ordering and review of laboratory studies, ordering and review of radiographic studies, pulse oximetry and re-evaluation of patient's condition.   Labs Review Labs Reviewed  CBC WITH DIFFERENTIAL/PLATELET - Abnormal; Notable for the following:    WBC 17.1 (*)    RBC 3.35 (*)    Hemoglobin 10.1 (*)    HCT 31.2 (*)    Neutrophils Relative % 84 (*)    Neutro Abs 14.5 (*)    Lymphocytes Relative 6 (*)    Monocytes Absolute 1.6 (*)    All other components within normal limits  COMPREHENSIVE METABOLIC PANEL - Abnormal; Notable for the following:    Sodium 134 (*)    Glucose, Bld 111 (*)    BUN 56 (*)    Creatinine, Ser 2.55 (*)    Total Protein 5.8 (*)    Albumin 2.7 (*)    AST 14 (*)    ALT 9 (*)     GFR calc non Af Amer 15 (*)    GFR calc Af Amer 18 (*)    All other components within normal limits  URINALYSIS, ROUTINE W REFLEX MICROSCOPIC (NOT AT [facility]) - Abnormal; Notable for the following:    APPearance CLOUDY (*)    Hgb urine dipstick SMALL (*)    Leukocytes, UA SMALL (*)    All other components within normal limits  URINE MICROSCOPIC-ADD ON - Abnormal; Notable for the following:    Squamous Epithelial / LPF FEW (*)    Bacteria, UA MANY (*)    Casts HYALINE CASTS (*)    All other components within normal limits  URINE CULTURE  TSH  TROPONIN I  CREATININE, URINE, RANDOM  SODIUM, URINE, RANDOM  CORTISOL  BASIC METABOLIC PANEL  CBC  I-STAT CG4 LACTIC ACID, ED    Imaging Review Dg Chest 2 View  12/08/2014   CLINICAL DATA:  Hypotension.  History of breast carcinoma.  EXAM: CHEST  2 VIEW  COMPARISON:  October 22, 2014  FINDINGS: There is stable elevation of the right hemidiaphragm. There is atelectasis in the right base. There is no edema or consolidation. Heart size and pulmonary vascularity are within normal limits. There is calcification throughout the aorta as well as in the mitral annulus. There are surgical clips the right axilla. No adenopathy. No bone lesions.  IMPRESSION: Stable marked elevation of the right hemidiaphragm with right base atelectasis. No edema or consolidation. No change in cardiac silhouette.   Electronically Signed   By: Lowella Grip III M.D.   On: 12/08/2014 15:28   Ct Head Wo Contrast  12/08/2014   CLINICAL DATA:  81 year old hypertensive female with history of breast cancer and blindness right eye presenting with weakness. Initial encounter.  EXAM: CT HEAD WITHOUT CONTRAST  TECHNIQUE: Contiguous axial images were obtained from the base of the skull through the vertex without intravenous contrast.  COMPARISON:  None.  FINDINGS: No intracranial hemorrhage.  No CT evidence of large acute infarct.  No hydrocephalus.  Mild age related atrophy.  No  intracranial mass lesion noted on this unenhanced exam.  Vascular calcifications.  Right phthisis bulbi.  No calvarial abnormality.  IMPRESSION: No intracranial hemorrhage or CT evidence of large acute infarct.   Electronically Signed   By: Genia Del M.D.   On: 12/08/2014 15:11     EKG Interpretation   Date/Time:  Sunday December 08 2014 16:07:55 EDT Ventricular Rate:  66 PR Interval:  156 QRS Duration: 116 QT Interval:  456 QTC Calculation: 478 R Axis:   3 Text Interpretation:  Sinus or ectopic atrial rhythm Incomplete left  bundle branch block No significant change since last tracing Confirmed by  [facility] MD, Corene Cornea [phone]) on 12/08/2014 4:51:06 PM      MDM   Final diagnoses:  Fatigue   Altered mental status and hypotension of unknown origin. Well get a CT scan to evaluate for head bleed is patient lives with her sister who has dementia and unsure if she has fallen recently. No evidence of trauma severity is unlikely. An x-ray to evaluate for pneumonia as she is recently the hospital. She has not had a cough or fever so if there is no infiltrate on x-ray, I doubt that she has pneumonia. We'll check urine as it sounds like last time she had urinary tract infection causing her low blood pressure. We'll give her fluids while waiting and see what response she has. Patient likely to be admitted to the hospital for further monitoring unless she perks up quickly.  Found to have elevated BUN/Cr c/w AKI compared to previous BP slightly improved. Likely related to dehydration and decreased by mouth intake.  The patient appears reasonably stabilized for admission considering the current resources, flow, and capabilities available in the ED at this time, and I doubt any other [facility] requiring further screening and/or treatment in the ED prior to admission.     Merrily Pew, MD 12/08/14 [phone]

## 2014-12-08 NOTE — ED Notes (Signed)
[facility] re:  Pt. Hypotensive.  Her daughter had taken her there with c/o weakness and "just not feeling well."  She shared with EMS that she recently had issues with u.t.i.'s.  Pt. Is in no distress.

## 2014-12-08 NOTE — H&P (Signed)
[facility] History and Physical  MICKENZIE STOLAR YDX:[id number] DOB: 1920/02/04 DOA: 12/08/2014  Referring physician:  Merrily Pew PCP:  Delia Chimes, NP   Chief Complaint:  Fatigue, dysuria  HPI:  The patient is a 81 y.o. year-old female with history of HTN, recurrent UTI, osteoarthritis, osteopenia who presents with fatigue.  The patient was last at their baseline health about three days ago.  She lives at home with her sister who also has some memory problems, but they have family that checks in frequently throughout the day, preparing meals, and watching out for them.  She normally ambulates with a rolling walker, but had some difficulty getting up the last few days.  She has not had fevers or chills, but she has had dysuria for the last day.  Denies cough, SOB, nausea, vomiting, diarrhea, constipation, abdominal pain.  She presented to urgent care but was sent to the ER due to hypotension.  In the ER, BP 96/49 and trended up with IVF.  Labs were notable for WBC 17.1, creatinine up to 2.55 from baseline of around 1.  CXR without acute change and UA with 7-10 WBC and small LE but many bacteria.  She was started on IVF and ceftriaxone.    Review of Systems:  General:  Denies fevers, chills, weight loss or gain HEENT:  Denies changes to hearing and vision, rhinorrhea, sinus congestion, sore throat CV:  Denies chest pain and palpitations, lower extremity edema.  PULM:  Denies SOB, wheezing, cough.   GI:  Denies nausea, vomiting, constipation, diarrhea.   GU:  Positive dysuria, frequency, urgency ENDO:  Denies polyuria, polydipsia.   HEME:  Denies hematemesis, blood in stools, melena, abnormal bruising or bleeding.  LYMPH:  Denies lymphadenopathy.   MSK:  Denies arthralgias, myalgias.   DERM:  Denies skin rash or ulcer.   NEURO:  Denies focal numbness, weakness, slurred speech, confusion, facial droop.  PSYCH:  Denies anxiety and depression.    Past Medical History  Diagnosis  Date  . Breast cancer   . Osteoarthritis   . Osteopenia   . Hypertension   . Blind right eye    Past Surgical History  Procedure Laterality Date  . Total hip arthroplasty      2  . Back surgery    . Breast lumpectomy    . Abdominal hysterectomy     Social History:  reports that she has never smoked. She does not have any smokeless tobacco history on file. She reports that she does not drink alcohol or use illicit drugs.   Allergies  Allergen Reactions  . Benazepril     unknown  . Norvasc [Amlodipine Besylate]     Reaction:Edema    Family History  Problem Relation Age of Onset  . Hypertension Father   . Hypertension Sister      Prior to Admission medications   Medication Sig Start Date End Date Taking? Authorizing Provider  amLODipine (NORVASC) 5 MG tablet Take 5 mg by mouth daily.     Yes Historical Provider, MD  celecoxib (CELEBREX) 200 MG capsule Take 200 mg by mouth daily.    Yes Historical Provider, MD  Cholecalciferol (VITAMIN D) 2000 UNITS CAPS Take by mouth.     Yes Historical Provider, MD  citalopram (CELEXA) 40 MG tablet Take 40 mg by mouth daily.   Yes Historical Provider, MD  cyclopentolate (CYCLODRYL,CYCLOGYL) 1 % ophthalmic solution Place 1 drop into the right eye 2 (two) times daily. 09/20/14  Yes Historical Provider, MD  furosemide (LASIX) 20 MG tablet Take 1 tablet (20 mg total) by mouth daily. 10/27/14  Yes Albertine Patricia, MD  levothyroxine (SYNTHROID, LEVOTHROID) 25 MCG tablet Take 1 tablet (25 mcg total) by mouth daily. 08/14/12  Yes Orlena Sheldon, PA-C  oxyCODONE-acetaminophen (PERCOCET/ROXICET) 5-325 MG per tablet Take 1 tablet by mouth every 4 (four) hours as needed for moderate pain or severe pain. 10/25/14  Yes Albertine Patricia, MD  prednisoLONE acetate (PRED FORTE) 1 % ophthalmic suspension Place 1 drop into the right eye 2 (two) times daily. 10/02/14  Yes Historical Provider, MD  ranitidine (ZANTAC) 75 MG tablet Take 75 mg by mouth 2 (two) times  daily.     Yes Historical Provider, MD  timolol (TIMOPTIC) 0.5 % ophthalmic solution Place 1 drop into the left eye 2 (two) times daily. 08/07/14  Yes Historical Provider, MD  TRAVATAN Z 0.004 % SOLN ophthalmic solution Place 1 drop into the left eye at bedtime. 09/23/14  Yes Historical Provider, MD   Physical Exam: Filed Vitals:   12/08/14 1424  BP: 96/49  Pulse: 68  Temp: 98.2 F (36.8 C)  TempSrc: Oral  Resp: 18  SpO2: 98%     General:  Adult female, NAD  Eyes:  Blind in right eye, anicteric, non-injected.  ENT:  Nares clear.  OP clear, non-erythematous without plaques or exudates.  MMM.  Neck:  Supple without TM or JVD.    Lymph:  No cervical, supraclavicular, or submandibular LAD.  Cardiovascular:  RRR, normal S1, S2, without m/r/g.  2+ pulses, warm extremities  Respiratory:  CTA bilaterally without increased WOB.  Abdomen:  NABS.  Soft, ND/NT.    Skin:  No rashes or focal lesions.  Musculoskeletal:  Normal bulk and tone.  Trace RLE edema and 1+ LLE edema.  Psychiatric:  A & O x to person, hospital, [place], but correct date, month, year, and day of week.  Appropriate affect.  Neurologic:  CN 3-12 intact except blind in right eye and HOH,  4/5 strength.  Sensation intact.  Labs on Admission:  Basic Metabolic Panel:  Recent Labs Lab 12/08/14 1447  NA 134*  K 4.0  CL 101  CO2 23  GLUCOSE 111*  BUN 56*  CREATININE 2.55*  CALCIUM 9.2   Liver Function Tests:  Recent Labs Lab 12/08/14 1447  AST 14*  ALT 9*  ALKPHOS 77  BILITOT 0.5  PROT 5.8*  ALBUMIN 2.7*   No results for input(s): LIPASE, AMYLASE in the last 168 hours. No results for input(s): AMMONIA in the last 168 hours. CBC:  Recent Labs Lab 12/08/14 1448  WBC 17.1*  NEUTROABS 14.5*  HGB 10.1*  HCT 31.2*  MCV 93.1  PLT 187   Cardiac Enzymes:  Recent Labs Lab 12/08/14 1445  TROPONINI <0.03    BNP (last 3 results)  Recent Labs  10/22/14 2238  BNP 739.7*    ProBNP  (last 3 results) No results for input(s): PROBNP in the last 8760 hours.  CBG: No results for input(s): GLUCAP in the last 168 hours.  Radiological Exams on Admission: Dg Chest 2 View  12/08/2014   CLINICAL DATA:  Hypotension.  History of breast carcinoma.  EXAM: CHEST  2 VIEW  COMPARISON:  October 22, 2014  FINDINGS: There is stable elevation of the right hemidiaphragm. There is atelectasis in the right base. There is no edema or consolidation. Heart size and pulmonary vascularity are within normal limits. There is calcification throughout the aorta as well as  in the mitral annulus. There are surgical clips the right axilla. No adenopathy. No bone lesions.  IMPRESSION: Stable marked elevation of the right hemidiaphragm with right base atelectasis. No edema or consolidation. No change in cardiac silhouette.   Electronically Signed   By: Lowella Grip III M.D.   On: 12/08/2014 15:28   Ct Head Wo Contrast  12/08/2014   CLINICAL DATA:  81 year old hypertensive female with history of breast cancer and blindness right eye presenting with weakness. Initial encounter.  EXAM: CT HEAD WITHOUT CONTRAST  TECHNIQUE: Contiguous axial images were obtained from the base of the skull through the vertex without intravenous contrast.  COMPARISON:  None.  FINDINGS: No intracranial hemorrhage.  No CT evidence of large acute infarct.  No hydrocephalus.  Mild age related atrophy.  No intracranial mass lesion noted on this unenhanced exam.  Vascular calcifications.  Right phthisis bulbi.  No calvarial abnormality.  IMPRESSION: No intracranial hemorrhage or CT evidence of large acute infarct.   Electronically Signed   By: Genia Del M.D.   On: 12/08/2014 15:11    EKG: Independently reviewed. NSR with incomplete BBB  Assessment/Plan Active Problems:   AKI (acute kidney injury)  ---  Acute kidney injury likely due to dehydration, creatinine 2.55, baseline 1 -  IVF and repeat BMP in AM -  FENa -  RUS if not  improving with hydration -  Renally dose medications and minimize nephrotoxins -  D/c lasix -  D/c citalopram and celebrex until kidney function improves  Hypotension, likely due to dehydration -  IVF -  Check cortisol level -  Hold BP medications  Probable UTI with dysuria -  Ceftriaxone -  F/u urine culture  Asymmetric lower extremity edema -  Duplex lower extremity  Generalized weakness - PT Eval  Leukocytosis, likely due to UTI and dehydration -  IVF and antibiotics and repeat in AM  Normocytic anemia likely chronic disease -  Repeat hgb in AM  Diet:  regular Access:  PIV IVF:  yes Proph:  lovenox  Code Status: full (verified with daughter as witnessed, although recommended DNR) Family Communication: patient and her daughter Disposition Plan: Admit to telemetry  Time spent: 60 min Janece Canterbury [facility] Pager [phone]  If 7PM-7AM, please contact night-coverage [email or web address] Password [facility] 12/08/2014, 5:59 PM

## 2014-12-09 ENCOUNTER — Ambulatory Visit (HOSPITAL_COMMUNITY): Payer: MEDICARE

## 2014-12-09 DIAGNOSIS — I10 Essential (primary) hypertension: Secondary | ICD-10-CM

## 2014-12-09 DIAGNOSIS — M7989 Other specified soft tissue disorders: Secondary | ICD-10-CM

## 2014-12-09 LAB — CORTISOL: Cortisol, Plasma: 25.2 ug/dL

## 2014-12-09 LAB — CBC
HCT: 31.2 % — ABNORMAL LOW (ref 36.0–46.0)
Hemoglobin: 9.9 g/dL — ABNORMAL LOW (ref 12.0–15.0)
MCH: 29.3 pg (ref 26.0–34.0)
MCHC: 31.7 g/dL (ref 30.0–36.0)
MCV: 92.3 fL (ref 78.0–100.0)
Platelets: 176 10*3/uL (ref 150–400)
RBC: 3.38 MIL/uL — ABNORMAL LOW (ref 3.87–5.11)
RDW: 13.8 % (ref 11.5–15.5)
WBC: 20.7 10*3/uL — ABNORMAL HIGH (ref 4.0–10.5)

## 2014-12-09 LAB — BASIC METABOLIC PANEL
ANION GAP: 7 (ref 5–15)
BUN: 53 mg/dL — ABNORMAL HIGH (ref 6–20)
CALCIUM: 9 mg/dL (ref 8.9–10.3)
CHLORIDE: 106 mmol/L (ref 101–111)
CO2: 23 mmol/L (ref 22–32)
Creatinine, Ser: 2.35 mg/dL — ABNORMAL HIGH (ref 0.44–1.00)
GFR calc Af Amer: 19 mL/min — ABNORMAL LOW (ref >60)
GFR calc non Af Amer: 17 mL/min — ABNORMAL LOW (ref >60)
GLUCOSE: 109 mg/dL — AB (ref 65–99)
Potassium: 3.4 mmol/L — ABNORMAL LOW (ref 3.5–5.1)
SODIUM: 136 mmol/L (ref 135–145)

## 2014-12-09 LAB — CREATININE, URINE, RANDOM: CREATININE, URINE: 45.35 mg/dL

## 2014-12-09 LAB — SODIUM, URINE, RANDOM: SODIUM UR: 64 mmol/L

## 2014-12-09 MED ORDER — ENSURE ENLIVE PO LIQD
237.0000 mL | Freq: Two times a day (BID) | ORAL | Status: DC
  Administered 2014-12-09 – 2014-12-10 (×3): 237 mL via ORAL

## 2014-12-09 MED ORDER — POTASSIUM CHLORIDE CRYS ER 20 MEQ PO TBCR
40.0000 meq | EXTENDED_RELEASE_TABLET | Freq: Once | ORAL | Status: AC
  Administered 2014-12-09: 40 meq via ORAL
  Filled 2014-12-09: qty 2

## 2014-12-09 NOTE — Progress Notes (Signed)
[facility]  Patient Status: Active (receiving services up to time of hospitalization)  [facility] is providing the following services: RN, PT and HHA  If patient discharges after hours, please call [phone].   Holly Mckee 12/09/2014, 10:18 AM

## 2014-12-09 NOTE — Progress Notes (Signed)
Initial Nutrition Assessment  DOCUMENTATION CODES:   Not applicable  INTERVENTION:  - Will order Ensure Enlive BID, each supplement provides 350 kcal and 20 grams of protein - RD will continue to monitor for needs  NUTRITION DIAGNOSIS:   Inadequate oral intake related to acute illness as evidenced by per patient/family report.  GOAL:   Patient will meet greater than or equal to 90% of their needs  MONITOR:   PO intake, Supplement acceptance, Weight trends, Labs  REASON FOR ASSESSMENT:   Malnutrition Screening Tool  ASSESSMENT:  81 y.o. year-old female with history of HTN, recurrent UTI, osteoarthritis, osteopenia who presents with fatigue. The patient was last at their baseline health about three days ago. She lives at home with her sister who also has some memory problems, but they have family that checks in frequently throughout the day, preparing meals, and watching out for them. She normally ambulates with a rolling walker, but had some difficulty getting up the last few days.  Pt seen for MST. BMI indicates normal weight status. Pt eating lunch at time of visit and ate ~25% up to that time. Pt states she does not feel well and does not have much of an appetite today. Family member in the room provides the rest of information.  Pt usually has a good appetite but has not for the past 1-2 days due to not feeling well. She does not have any difficulties chewing. PTA she had lost weight but, family member reports, "you could not even tell." Per weight hx review, pt has lost 9 lbs (7% body weight) in the past month which is significant for time frame.  No muscle or fat wasting noted. Not meeting needs at this time. Will order Ensure Enlive BID. Medications reviewed. Labs reviewed; K: 3.4 mmol/L, BUN/creatinine elevated, GFR: 17.   Diet Order:  Diet regular Room service appropriate?: Yes; Fluid consistency:: Thin  Skin:  Wound (see comment) (R arm wound)  Last BM:   PTA  Height:   Ht Readings from Last 1 Encounters:  12/08/14 5\' 3"  (1.6 m)    Weight:   Wt Readings from Last 1 Encounters:  12/08/14 137 lb 3.2 oz (62.234 kg)    Ideal Body Weight:  52.27 kg (kg)  Wt Readings from Last 10 Encounters:  12/08/14 137 lb 3.2 oz (62.234 kg)  10/23/14 146 lb 15.7 oz (66.67 kg)    BMI:  Body mass index is 24.31 kg/(m^2).  Estimated Nutritional Needs:   Kcal:  1245-1445  Protein:  55-65 grams  Fluid:  2.2 L/day  EDUCATION NEEDS:   No education needs identified at this time    Jarome Matin, RD, LDN Inpatient Clinical Dietitian Pager # [phone] After hours/weekend pager # [phone]

## 2014-12-09 NOTE — Progress Notes (Signed)
Patient ID: Holly Mckee, female   DOB: 12-22-1919, 81 y.o.   MRN: [id number] [facility] PROGRESS NOTE  Myiah Petkus Naclerio [id number] DOB: [birth year] DOA: 12/08/2014 PCP: Delia Chimes, NP  Brief narrative:    81 year old female with past medical history of hypertension, depression, hypothyroidism who presented to [facility] long hospital with worsening weakness, fatigue for past 3 days prior to this admission. She lives at home with her sister and the family at the bedside reports underlying memory problems but overall patient is able to take care of herself. On admission, patient was slightly hypotensive but this has improved with IV fluids. Her white blood cell count was 17.1 and creatinine elevated at 2.55, baseline is around 1. Chest x-ray did not show acute cardiopulmonary findings but urinalysis was significant for leukocytes and many bacteria. She was started on them. Rocephin.  Barrier to discharge: Patient is on Rocephin for urinary tract infection. For some reason her white blood cell count is trending up. She needs physical therapy evaluation for safe discharge plan.  Assessment/Plan:    Principal problem: Generalized weakness -Likely from combination of advanced age and urinary tract infection  - She is being treated for UTI  - Obtain physical therapy evaluation  Active problems:  Urinary tract infection / Leukocytosis  - Continue empiric Rocephin - Follow up urine culture results   Essential hypertension  - Blood pressure medications on hold because of soft blood pressure.   Acute renal failure - Likely secondary to urinary tract infection, dehydration - Creatinine is improving with fluids  Hypokalemia - Likely from Lasix which was placed on hold because of acute renal failure - Potassium supplemented  Normocytic anemia - Hemoglobin stable  Failure to thrive in adult / dehydration - Nutrition consulted    DVT Prophylaxis  - Lovenox subQ ordered     Code Status: Full.  Family Communication:  plan of care discussed with the patient and her daughter at the bedside  Disposition Plan: Needs PT eval for safe discharge plan    IV access:  Peripheral IV  Procedures and diagnostic studies:    Dg Chest 2 View 12/08/2014  Stable marked elevation of the right hemidiaphragm with right base atelectasis. No edema or consolidation. No change in cardiac silhouette.    Ct Head Wo Contrast 12/08/2014  No intracranial hemorrhage or CT evidence of large acute infarct.    Medical Consultants:  None  Other Consultants:  Physical therapy Nutrition  IAnti-Infectives:   Rocephin 12/08/2014 -->   Sharisa Toves, MD  [facility] Pager [phone]  Time spent in minutes: 25 minutes  If 7PM-7AM, please contact night-coverage [email or web address] Password [facility] 12/09/2014, 2:17 PM   LOS: 1 day    HPI/Subjective: No acute overnight events. Patient reports feeling weak.  Objective: Filed Vitals:   12/08/14 2121 12/09/14 0503 12/09/14 0900 12/09/14 1322  BP: 110/40 101/44 126/54 115/51  Pulse: 81 79 79 79  Temp: 98.2 F (36.8 C) 97.8 F (36.6 C)  97.8 F (36.6 C)  TempSrc: Oral Oral  Oral  Resp: 20 18 18 18   Height:      Weight:      SpO2: 90% 91% 93% 93%    Intake/Output Summary (Last 24 hours) at 12/09/14 1417 Last data filed at 12/09/14 0630  Gross per 24 hour  Intake 1176.67 ml  Output    200 ml  Net 976.67 ml    Exam:   General:  Pt is alert, follows commands appropriately, not in acute  distress  Cardiovascular: Regular rate and rhythm, S1/S2 (+)  Respiratory: Clear to auscultation bilaterally, no wheezing, no crackles, no rhonchi  Abdomen: Soft, non tender, non distended, bowel sounds present  Extremities: No edema, pulses DP and PT palpable bilaterally  Neuro: Grossly nonfocal  Data Reviewed: Basic Metabolic Panel:  Recent Labs Lab 12/08/14 1447 12/09/14 0536  NA 134* 136  K 4.0 3.4*  CL 101 106  CO2  23 23  GLUCOSE 111* 109*  BUN 56* 53*  CREATININE 2.55* 2.35*  CALCIUM 9.2 9.0   Liver Function Tests:  Recent Labs Lab 12/08/14 1447  AST 14*  ALT 9*  ALKPHOS 77  BILITOT 0.5  PROT 5.8*  ALBUMIN 2.7*   No results for input(s): LIPASE, AMYLASE in the last 168 hours. No results for input(s): AMMONIA in the last 168 hours. CBC:  Recent Labs Lab 12/08/14 1448 12/09/14 0536  WBC 17.1* 20.7*  NEUTROABS 14.5*  --   HGB 10.1* 9.9*  HCT 31.2* 31.2*  MCV 93.1 92.3  PLT 187 176   Cardiac Enzymes:  Recent Labs Lab 12/08/14 1445  TROPONINI <0.03   BNP: Invalid input(s): POCBNP CBG: No results for input(s): GLUCAP in the last 168 hours.  No results found for this or any previous visit (from the past 240 hour(s)).   Scheduled Meds: . cefTRIAXone (ROCEPHIN)  IV  1 g Intravenous Q24H  . docusate sodium  100 mg Oral BID  . enoxaparin (LOVENOX) injection  30 mg Subcutaneous Q24H  . [START ON 12/10/2014] famotidine  20 mg Oral QODAY  . feeding supplement (ENSURE ENLIVE)  237 mL Oral BID BM  . levothyroxine  25 mcg Oral QAC breakfast  . potassium chloride  40 mEq Oral Once   Continuous Infusions: . sodium chloride 50 mL/hr at 12/09/14 0941

## 2014-12-09 NOTE — Progress Notes (Signed)
*  Preliminary Results* Left lower extremity venous duplex completed. Left lower extremity is negative for deep vein thrombosis. There is no evidence of left Baker's cyst.  12/09/2014 3:22 PM  Maudry Mayhew, RVT, RDCS, RDMS

## 2014-12-09 NOTE — Progress Notes (Signed)
PT Cancellation Note  Patient Details Name: Holly Mckee MRN: [id number] DOB: 12-06-1919   Cancelled Treatment:    Reason Eval/Treat Not Completed: Medical issues which prohibited therapy (awaiting LE doppler results)   Benicio Manna,KATHrine E 12/09/2014, 2:54 PM Carmelia Bake, PT, DPT 12/09/2014 Pager: [phone]

## 2014-12-10 DIAGNOSIS — N179 Acute kidney failure, unspecified: Secondary | ICD-10-CM | POA: Diagnosis present

## 2014-12-10 DIAGNOSIS — R531 Weakness: Secondary | ICD-10-CM

## 2014-12-10 DIAGNOSIS — D72829 Elevated white blood cell count, unspecified: Secondary | ICD-10-CM | POA: Diagnosis present

## 2014-12-10 DIAGNOSIS — D649 Anemia, unspecified: Secondary | ICD-10-CM | POA: Diagnosis present

## 2014-12-10 DIAGNOSIS — N390 Urinary tract infection, site not specified: Secondary | ICD-10-CM

## 2014-12-10 DIAGNOSIS — B9620 Unspecified Escherichia coli [E. coli] as the cause of diseases classified elsewhere: Secondary | ICD-10-CM | POA: Diagnosis present

## 2014-12-10 LAB — CBC
HCT: 31 % — ABNORMAL LOW (ref 36.0–46.0)
Hemoglobin: 10.2 g/dL — ABNORMAL LOW (ref 12.0–15.0)
MCH: 30.6 pg (ref 26.0–34.0)
MCHC: 32.9 g/dL (ref 30.0–36.0)
MCV: 93.1 fL (ref 78.0–100.0)
Platelets: 198 K/uL (ref 150–400)
RBC: 3.33 MIL/uL — ABNORMAL LOW (ref 3.87–5.11)
RDW: 14.1 % (ref 11.5–15.5)
WBC: 18.8 K/uL — ABNORMAL HIGH (ref 4.0–10.5)

## 2014-12-10 LAB — BASIC METABOLIC PANEL WITH GFR
Anion gap: 9 (ref 5–15)
BUN: 45 mg/dL — ABNORMAL HIGH (ref 6–20)
CO2: 21 mmol/L — ABNORMAL LOW (ref 22–32)
Calcium: 9.5 mg/dL (ref 8.9–10.3)
Chloride: 108 mmol/L (ref 101–111)
Creatinine, Ser: 2.26 mg/dL — ABNORMAL HIGH (ref 0.44–1.00)
GFR calc Af Amer: 20 mL/min — ABNORMAL LOW
GFR calc non Af Amer: 18 mL/min — ABNORMAL LOW
Glucose, Bld: 128 mg/dL — ABNORMAL HIGH (ref 65–99)
Potassium: 4 mmol/L (ref 3.5–5.1)
Sodium: 138 mmol/L (ref 135–145)

## 2014-12-10 MED ORDER — AMLODIPINE BESYLATE 5 MG PO TABS
5.0000 mg | ORAL_TABLET | Freq: Every day | ORAL | Status: DC
  Administered 2014-12-11: 5 mg via ORAL
  Filled 2014-12-10: qty 1

## 2014-12-10 NOTE — Care Management Important Message (Signed)
Important Message  Patient Details  Name: Holly Mckee MRN: [id number] Date of Birth: Sep 28, 1919   Medicare Important Message Given:  Yes-second notification given    Camillo Flaming 12/10/2014, 2:16 [place] Message  Patient Details  Name: Holly Mckee MRN: [id number] Date of Birth: [birth year]   Medicare Important Message Given:  Yes-second notification given    Camillo Flaming 12/10/2014, 2:16 PM

## 2014-12-10 NOTE — Clinical Social Work Placement (Signed)
   CLINICAL SOCIAL WORK PLACEMENT  NOTE  Date:  12/10/2014  Patient Details  Name: Holly Mckee MRN: [id number] Date of Birth: 11/03/1919  Clinical Social Work is seeking post-discharge placement for this patient at the [place] level of care (*CSW will initial, date and re-position this form in  chart as items are completed):  Yes   Patient/family provided with [place] Work Department's list of facilities offering this level of care within the geographic area requested by the patient (or if unable, by the patient's family).  Yes   Patient/family informed of their freedom to choose among providers that offer the needed level of care, that participate in Medicare, Medicaid or managed care program needed by the patient, have an available bed and are willing to accept the patient.  Yes   Patient/family informed of [facility]'s ownership interest in [facility] and [facility], as well as of the fact that they are under no obligation to receive care at these facilities.  PASRR submitted to EDS on 12/10/14     PASRR number received on 12/10/14     Existing PASRR number confirmed on       FL2 transmitted to all facilities in geographic area requested by pt/family on 12/10/14     FL2 transmitted to all facilities within larger geographic area on 12/10/14     Patient informed that his/her managed care company has contracts with or will negotiate with certain facilities, including the following:            Patient/family informed of bed offers received.  Patient chooses bed at       Physician recommends and patient chooses bed at      Patient to be transferred to   on  .  Patient to be transferred to facility by       Patient family notified on   of transfer.  Name of family member notified:        PHYSICIAN Please sign FL2     Additional Comment:    _______________________________________________ Ladell Pier,  LCSW 12/10/2014, 3:33 PM

## 2014-12-10 NOTE — Clinical Social Work Note (Signed)
Clinical Social Work Assessment  Patient Details  Name: Holly Mckee MRN: [id number] Date of Birth: 08-13-1919  Date of referral:  12/10/14               Reason for consult:  Discharge Planning                Permission sought to share information with:  Family Supports Permission granted to share information::  Yes, Verbal Permission Granted  Name::     Cate Oravec  Agency::     Relationship::  daughter-in-law (at bedside)  Contact Information:  [phone]  Housing/Transportation Living arrangements for the past 2 months:  Single Family Home Source of Information:  Other (Comment Required) (pt daughter-in-law, Vivien Rota) Patient Interpreter Needed:  None Criminal Activity/Legal Involvement Pertinent to Current Situation/Hospitalization:  No - Comment as needed Significant Relationships:  Adult Children, Siblings Lives with:  Siblings Do you feel safe going back to the place where you live?  No Need for family participation in patient care:  Yes (Comment)  Care giving concerns:  Pt from home and lives with pt elderly sister. Pt recently went to rehab, but returned home and did well for a few weeks, but has gotten weaker again and recommendation for rehab at [facility] following this hospitalization.   Social Worker assessment / plan:  CSW received referral for New SNF.   CSW met with pt and pt daughter-in-law, Vivien Rota at bedside. CSW introduced self and explained role. Pt sleeping during time of visit. Pt awoke for a few minutes, but stated that she is very "sleepy" and fell back asleep. Pt daughter-in-law discussed that pt went to [facility] recently and did very well at the facility and stayed in rehab for three weeks before returning home. Pt daughter-in-law stated that pt did well at home for a few weeks, but recently became very weak again. CSW discussed recommendation for rehab at SNF again and pt daughter-in-law feels that is the best plan for pt following hospitalization. Pt daughter  discussed that pt and pt family were very pleased with [facility] and would like for pt to return to facility if pt can have private room. Pt daughter-in-law stated that other options would need to be explored in [facility] if private room not available.   CSW completed FL2 and initiated SNF search to [facility]. CSW spoke with [facility] and facility stated that private room available Wednesday 7/27 if pt medically ready for discharge.  CSW to follow up with pt and pt family regarding bed offer and explore other SNF if pt is not yet ready for discharge when private room available at [facility] to continue to follow to provide support and assist with pt disposition needs.   Employment status:  Retired Forensic scientist:  Medicare PT Recommendations:  Page / Referral to community resources:  [place]  Patient/Family's Response to care:  Per chart, pt alert and oriented to person only. Pt family supportive and actively involved in pt care. Pt family was very pleased [facility] and hopeful [facility] will have a private room for pt in order for pt to go to a facility that pt is familiar with.  Patient/Family's Understanding of and Emotional Response to Diagnosis, Current Treatment, and Prognosis:  Pt daughter-in-law displayed understanding about pt diagnosis and treatment plan. Pt sleeping on and off throughout visit.   Emotional Assessment Appearance:  Appears stated age Attitude/Demeanor/Rapport:  Other (pt  expressed feeling very tired/sleeping all day) Affect (typically observed):  Quiet (pt very sleepy during visit) Orientation:  Oriented to Self Alcohol / Substance use:  Not Applicable Psych involvement (Current and /or in the community):  No (Comment)  Discharge Needs  Concerns to be addressed:  Discharge Planning Concerns Readmission within the last 30 days:   No Current discharge risk:  Physical Impairment Barriers to Discharge:  Continued Medical Work up   Alison Murray A, [place] 12/10/2014, 3:19 PM [phone]

## 2014-12-10 NOTE — Progress Notes (Addendum)
Patient ID: DEFNE GERLING, female   DOB: 1920/03/07, 81 y.o.   MRN: [id number] [facility] PROGRESS NOTE  Linetta Regner Machia [id number] DOB: 1919/10/04 DOA: 12/08/2014 PCP: Delia Chimes, NP  Brief narrative:    81 year old female with past medical history of hypertension, depression, hypothyroidism who presented to [facility] long hospital with worsening weakness, fatigue for past 3 days prior to this admission. She lives at home with her sister and the family at the bedside reports underlying memory problems but overall patient is able to take care of herself. On admission, patient was slightly hypotensive but this has improved with IV fluids. Her white blood cell count was 17.1 and creatinine elevated at 2.55, baseline is around 1. Chest x-ray did not show acute cardiopulmonary findings but urinalysis was significant for leukocytes and many bacteria. She was started on them. Rocephin.  Barrier to discharge: Patient is on Rocephin for urinary tract infection. Awaiting sensitivity report. D/C by 7/27 if sens report back and if WBC count improving.   Assessment/Plan:    Principal problem: Generalized weakness - Likely from combination of advanced age and urinary tract infection  - Continue treatment for UTI  - PT recommends SNF and SW assisting discharge plan.   Active problems:  E.Coli urinary tract infection / Leukocytosis  - Continue empiric Rocephin until sensitivity report is back. - Leukocytosis is improving   Essential hypertension  - Blood pressure medications on hold initially because of soft blood pressure.  - SBP in 140's so we will resume Norvasc   Acute renal failure - Likely secondary to urinary tract infection, dehydration, lasix (lasix on hold) - Creatinine is improving with fluids  Hypokalemia - Likely from Lasix which was placed on hold because of acute renal failure - Potassium supplemented and WNL  Normocytic anemia - Hemoglobin stable  Failure to  thrive in adult / dehydration - Nutrition consulted    DVT Prophylaxis  - Lovenox subQ ordered while pt in hospital    Code Status: Full.  Family Communication:  plan of care discussed with the patient and her daughter at the bedside  Disposition Plan: likely discharge 7/27 if WBC count improving    IV access:  Peripheral IV  Procedures and diagnostic studies:    Dg Chest 2 View 12/08/2014  Stable marked elevation of the right hemidiaphragm with right base atelectasis. No edema or consolidation. No change in cardiac silhouette.    Ct Head Wo Contrast 12/08/2014  No intracranial hemorrhage or CT evidence of large acute infarct.    Medical Consultants:  None  Other Consultants:  Physical therapy Nutrition  IAnti-Infectives:   Rocephin 12/08/2014 -->   DEVINE, ALMA, MD  [facility] Pager [phone]  Time spent in minutes: 25 minutes  If 7PM-7AM, please contact night-coverage [email or web address] Password [facility] 12/10/2014, 8:52 PM   LOS: 2 days    HPI/Subjective: No acute overnight events. Patient reports feeling better this am.   Objective: Filed Vitals:   12/09/14 2134 12/10/14 0540 12/10/14 1714 12/10/14 2040  BP: 113/50 117/53 121/48 144/59  Pulse: 74 62 72 79  Temp: 99.2 F (37.3 C) 98 F (36.7 C) 98.3 F (36.8 C) 98.6 F (37 C)  TempSrc: Oral Oral Oral Oral  Resp: 20 16 20 22   Height:      Weight:      SpO2: 93% 90% 93% 94%    Intake/Output Summary (Last 24 hours) at 12/10/14 2052 Last data filed at 12/10/14 1500  Gross per 24 hour  Intake   1170 ml  Output    100 ml  Net   1070 ml    Exam:   General:  Pt is alert, not in acute distress  Cardiovascular: Regular rate and rhythm, S1/S2 appreciated   Respiratory: No wheezing, no crackles, no rhonchi  Abdomen: (+) BS, non tender, non distended   Extremities: No leg swelling, pulses palpable   Neuro: Nonfocal  Data Reviewed: Basic Metabolic Panel:  Recent Labs Lab 12/08/14 1447  12/09/14 0536 12/10/14 0437  NA 134* 136 138  K 4.0 3.4* 4.0  CL 101 106 108  CO2 23 23 21*  GLUCOSE 111* 109* 128*  BUN 56* 53* 45*  CREATININE 2.55* 2.35* 2.26*  CALCIUM 9.2 9.0 9.5   Liver Function Tests:  Recent Labs Lab 12/08/14 1447  AST 14*  ALT 9*  ALKPHOS 77  BILITOT 0.5  PROT 5.8*  ALBUMIN 2.7*   No results for input(s): LIPASE, AMYLASE in the last 168 hours. No results for input(s): AMMONIA in the last 168 hours. CBC:  Recent Labs Lab 12/08/14 1448 12/09/14 0536 12/10/14 0437  WBC 17.1* 20.7* 18.8*  NEUTROABS 14.5*  --   --   HGB 10.1* 9.9* 10.2*  HCT 31.2* 31.2* 31.0*  MCV 93.1 92.3 93.1  PLT 187 176 198   Cardiac Enzymes:  Recent Labs Lab 12/08/14 1445  TROPONINI <0.03   BNP: Invalid input(s): POCBNP CBG: No results for input(s): GLUCAP in the last 168 hours.  Recent Results (from the past 240 hour(s))  Urine culture     Status: None (Preliminary result)   Collection Time: 12/08/14  4:41 PM  Result Value Ref Range Status   Specimen Description URINE, CATHETERIZED  Final   Special Requests Normal  Final   Culture   Final    >=100,000 COLONIES/mL ESCHERICHIA COLI Performed at [facility]    Report Status PENDING  Incomplete     . cefTRIAXone (ROCEPHIN)  IV  1 g Intravenous Q24H  . cyclopentolate  1 drop Right Eye BID  . docusate sodium  100 mg Oral BID  . enoxaparin (LOVENOX) injection  30 mg Subcutaneous Q24H  . famotidine  20 mg Oral QODAY  . feeding supplement (ENSURE ENLIVE)  237 mL Oral BID BM  . latanoprost  1 drop Left Eye QHS  . levothyroxine  25 mcg Oral QAC breakfast  . prednisoLONE acetate  1 drop Right Eye BID  . sodium chloride  3 mL Intravenous Q12H  . timolol  1 drop Left Eye BID

## 2014-12-10 NOTE — Evaluation (Signed)
Physical Therapy Evaluation Patient Details Name: Holly Mckee MRN: [id number] DOB: May 31, 1919 Today's Date: 12/10/2014   History of Present Illness  81 year old female admitted to [facility] long hospital with generalized weakness and UTI.    Hx of breast cancer, OA, osteopenia, HTN, blind R eye. Pt lives at home with her sister (who has dementia).  Clinical Impression  Pt admitted with above diagnosis. Pt currently with functional limitations due to the deficits listed below (see PT Problem List).  Pt will benefit from skilled PT to increase their independence and safety with mobility to allow discharge to the venue listed below.   Pt feeling weak and requiring min assist for mobility overall.  Pt lives with her sister however sister has dementia.  Pt has been requiring more assist for ADLs prior to admission.  Daughter in law present and reports pt has been to [facility] in the past and agreeable to SNF for rehab upon d/c as pt did well with previous experience.     Follow Up Recommendations SNF;Supervision/Assistance - 24 hour    Equipment Recommendations  None recommended by PT    Recommendations for Other Services       Precautions / Restrictions Precautions Precautions: Fall Restrictions Weight Bearing Restrictions: No      Mobility  Bed Mobility Overal bed mobility: Needs Assistance Bed Mobility: Supine to Sit;Sit to Supine     Supine to sit: Min guard;HOB elevated Sit to supine: Min guard   General bed mobility comments: increased time however able to complete without physical assist, required verbal cues for technique  Transfers Overall transfer level: Needs assistance Equipment used: Rolling walker (2 wheeled) Transfers: Sit to/from Stand Sit to Stand: Min assist         General transfer comment: assist to rise and steady, verbal cues for hand placement  Ambulation/Gait Ambulation/Gait assistance: Min guard Ambulation Distance (Feet): 40 Feet Assistive  device: Rolling walker (2 wheeled) Gait Pattern/deviations: Step-through pattern;Trunk flexed;Decreased stride length     General Gait Details: distance limited by fatigue, pt unable to complete trunk extension (baseline per daughter in law) however able to somewhat correct with verbal cues for posture  Stairs            Wheelchair Mobility    Modified Rankin (Stroke Patients Only)       Balance Overall balance assessment: History of Falls (however not in the past 6 months)                                           Pertinent Vitals/Pain Pain Assessment: No/denies pain    Home Living Family/patient expects to be discharged to:: Private residence Living Arrangements:  (sister) Available Help at Discharge: Home health Type of Home: House Home Access: Stairs to enter;Ramped entrance       Home Equipment: Clinical cytogeneticist - 4 wheels      Prior Function Level of Independence: Needs assistance   Gait / Transfers Assistance Needed: uses rollator at baseline-household  ADL's / Homemaking Assistance Needed: CNA helps with bathing, has Dante RN and PT as well        Hand Dominance        Extremity/Trunk Assessment   Upper Extremity Assessment: Generalized weakness           Lower Extremity Assessment: Generalized weakness      Cervical / Trunk Assessment: Kyphotic  Communication   Communication: HOH  Cognition Arousal/Alertness: Awake/alert Behavior During Therapy: WFL for tasks assessed/performed Overall Cognitive Status: Within Functional Limits for tasks assessed                      General Comments      Exercises        Assessment/Plan    PT Assessment Patient needs continued PT services  PT Diagnosis Difficulty walking;Generalized weakness   PT Problem List Decreased strength;Decreased activity tolerance;Decreased balance;Decreased mobility  PT Treatment Interventions DME instruction;Gait training;Functional  mobility training;Therapeutic activities;Patient/family education;Therapeutic exercise;Balance training   PT Goals (Current goals can be found in the Care Plan section) Acute Rehab PT Goals PT Goal Formulation: With patient/family Time For Goal Achievement: 12/17/14 Potential to Achieve Goals: Good    Frequency Min 3X/week   Barriers to discharge        Co-evaluation               End of Session Equipment Utilized During Treatment: Gait belt Activity Tolerance: Patient limited by fatigue Patient left: in bed;with call bell/phone within reach;with bed alarm set;with family/visitor present Nurse Communication: Mobility status         Time: 4287-6811 PT Time Calculation (min) (ACUTE ONLY): 19 min   Charges:   PT Evaluation $Initial PT Evaluation Tier I: 1 Procedure     PT G Codes:        Tobey Lippard,KATHrine E 12/10/2014, 10:25 AM Carmelia Bake, PT, DPT 12/10/2014 Pager: [phone]

## 2014-12-11 DIAGNOSIS — N179 Acute kidney failure, unspecified: Secondary | ICD-10-CM

## 2014-12-11 DIAGNOSIS — N390 Urinary tract infection, site not specified: Principal | ICD-10-CM

## 2014-12-11 DIAGNOSIS — D72829 Elevated white blood cell count, unspecified: Secondary | ICD-10-CM

## 2014-12-11 DIAGNOSIS — E876 Hypokalemia: Secondary | ICD-10-CM

## 2014-12-11 DIAGNOSIS — B9620 Unspecified Escherichia coli [E. coli] as the cause of diseases classified elsewhere: Secondary | ICD-10-CM

## 2014-12-11 DIAGNOSIS — R531 Weakness: Secondary | ICD-10-CM

## 2014-12-11 LAB — BASIC METABOLIC PANEL
ANION GAP: 7 (ref 5–15)
BUN: 43 mg/dL — AB (ref 6–20)
CHLORIDE: 109 mmol/L (ref 101–111)
CO2: 23 mmol/L (ref 22–32)
CREATININE: 1.9 mg/dL — AB (ref 0.44–1.00)
Calcium: 9.9 mg/dL (ref 8.9–10.3)
GFR calc non Af Amer: 22 mL/min — ABNORMAL LOW (ref >60)
GFR, EST AFRICAN AMERICAN: 25 mL/min — AB (ref >60)
GLUCOSE: 118 mg/dL — AB (ref 65–99)
POTASSIUM: 3.8 mmol/L (ref 3.5–5.1)
SODIUM: 139 mmol/L (ref 135–145)

## 2014-12-11 LAB — URINE CULTURE
Culture: >=100000
Special Requests: NORMAL

## 2014-12-11 LAB — CBC
HCT: 33.3 % — ABNORMAL LOW (ref 36.0–46.0)
Hemoglobin: 10.9 g/dL — ABNORMAL LOW (ref 12.0–15.0)
MCH: 30.7 pg (ref 26.0–34.0)
MCHC: 32.7 g/dL (ref 30.0–36.0)
MCV: 93.8 fL (ref 78.0–100.0)
Platelets: 219 10*3/uL (ref 150–400)
RBC: 3.55 MIL/uL — ABNORMAL LOW (ref 3.87–5.11)
RDW: 14.5 % (ref 11.5–15.5)
WBC: 19.9 10*3/uL — ABNORMAL HIGH (ref 4.0–10.5)

## 2014-12-11 MED ORDER — ENSURE ENLIVE PO LIQD: 237.0000 mL | Freq: Two times a day (BID) | ORAL | Status: AC

## 2014-12-11 MED ORDER — OXYCODONE-ACETAMINOPHEN 5-325 MG PO TABS: 1.0000 | ORAL_TABLET | ORAL | Status: AC | PRN

## 2014-12-11 MED ORDER — CEPHALEXIN 500 MG PO CAPS: 500.0000 mg | ORAL_CAPSULE | Freq: Two times a day (BID) | ORAL | Status: AC

## 2014-12-11 MED ORDER — SACCHAROMYCES BOULARDII 250 MG PO CAPS: 250.0000 mg | ORAL_CAPSULE | Freq: Two times a day (BID) | ORAL | Status: AC

## 2014-12-11 MED ORDER — DOCUSATE SODIUM 100 MG PO CAPS: 100.0000 mg | ORAL_CAPSULE | Freq: Two times a day (BID) | ORAL | Status: AC

## 2014-12-11 MED ORDER — SACCHAROMYCES BOULARDII 250 MG PO CAPS: 250.0000 mg | ORAL_CAPSULE | Freq: Two times a day (BID) | ORAL | Status: DC

## 2014-12-11 NOTE — Progress Notes (Signed)
CSW continuing to follow.   CSW followed up with pt and pt daughter-in-law at bedside regarding disposition planning.   CSW discussed with pt and daughter-in-law that [facility]. Pt and pt daughter-in-law agreeable to plan for [facility].  CSW spoke with MD who stated that pt E. Coli sensitivities will be back today and anticipate discharge today.  CSW contacted [facility] and confirmed that pt and pt family wanted to accept bed offer. [facility] confirmed that facility can accept pt today.  CSW to continue to follow to provide support and assist with pt discharge to [facility] this afternoon.  Alison Murray, MSW, [place] Work [phone]

## 2014-12-11 NOTE — Discharge Summary (Signed)
Physician Discharge Summary  Holly Mckee UVO:[id number] DOB: February 16, 1920 DOA: 12/08/2014  PCP: Delia Chimes, NP  Admit date: 12/08/2014 Discharge date: 12/11/2014  Recommendations for Outpatient Follow-up:  1. To SNF for ongoing PT/OT 2. Repeat CBC and BMP in 1 week to follow up leukocytosis, anemia, and creatinine 3. Continue to hold celexa and celebrex until her creatinine has trended down further but may be able to resume if creatinine back to near-baseline in 1 week. 4. If she develops swelling or SOB, consider resuming her lasix, otherwise readdress lasix in 1 week after repeat BMP.  Discharge Diagnoses:  Principal Problem:   General weakness Active Problems:   Hypertension   Hypokalemia   E-coli UTI   Leukocytosis   ARF (acute renal failure)   Normocytic anemia   Discharge Condition: stable, improved  Diet recommendation: regular  Wt Readings from Last 3 Encounters:  12/08/14 62.234 kg (137 lb 3.2 oz)  10/23/14 66.67 kg (146 lb 15.7 oz)    History of present illness:  81 year old female with past medical history of hypertension, depression, hypothyroidism who presented to [facility] long hospital with worsening weakness, fatigue for past 3 days prior to this admission. She lives at home with her sister and the family at the bedside reports underlying memory problems but overall patient is able to take care of herself. On admission, patient was slightly hypotensive but this has improved with IV fluids. Her white blood cell count was 17.1 and creatinine elevated at 2.55, baseline is around 1. Chest x-ray did not show acute cardiopulmonary findings but urinalysis was significant for leukocytes and many bacteria. She was started on them. Rocephin.   Hospital Course:   Generalized weakness, likely from combination of advanced age and urinary tract infection.  She was given IVF, antibiotics and seen by PT/OT who recommended SNF for ongoing rehabilitation.    Active problems:   Urinary tract infection / Leukocytosis.  She was started on ceftriaxone and her urine culture grew pansensitive E. Coli.  She should continue keflex to complete a 7-day course of antibiotics.    Essential hypertension, blood pressure medications on hold because of hypotension.  Her cortisol level was 25 and her blood pressure improved with IVF.  Continue to hold BP medications with goal SBP of 150 given age.   Acute renal failure, likely secondary to urinary tract infection, dehydration, and hypotension.  Her creatinine trended down with IVF.  She should avoid diuretics and nephrotoxins and have repeat BMP done in about  1 week.  Continue to hold celexa and celebrex until her creatinine has trended down further but may be able to resume if creatinine back to near-baseline in 1 week.  Hypokalemia likely from Lasix which was placed on hold because of acute renal failure.  Potassium was supplemented and hypokalemia resolved.    Anemia of chronic disease with stable hemoglobin near 10.9mg /dl on day of discharge.   Failure to thrive in adult / dehydration, Nutrition consulted and recommended supplements.    IV access:  Peripheral IV  Procedures and diagnostic studies:   Dg Chest 2 View 12/08/2014 Stable marked elevation of the right hemidiaphragm with right base atelectasis. No edema or consolidation. No change in cardiac silhouette.   Ct Head Wo Contrast 12/08/2014 No intracranial hemorrhage or CT evidence of large acute infarct.   Medical Consultants:  None  Other Consultants:  Physical therapy Nutrition  IAnti-Infectives:   Rocephin 12/08/2014 --> 7/27 Keflex 7/27 >  Discharge Exam: Filed Vitals:   12/11/14 0713  BP: 140/65  Pulse: 68  Temp: 97.5 F (36.4 C)  Resp: 20   Filed Vitals:   12/10/14 0540 12/10/14 1714 12/10/14 2040 12/11/14 0713  BP: 117/53 121/48 144/59 140/65  Pulse: 62 72 79 68  Temp: 98 F (36.7 C) 98.3 F (36.8 C) 98.6 F  (37 C) 97.5 F (36.4 C)  TempSrc: Oral Oral Oral Oral  Resp: 16 20 22 20   Height:      Weight:      SpO2: 90% 93% 94% 92%    General: Adult female, blind in right eye, NAD Cardiovascular: RRR, no mrg, 2+ pulses, warm extremities Respiratory: CTAB ABD:  NABS, soft, ND/NT MSK:  No LEE  Discharge Instructions      Discharge Instructions    Call MD for:  difficulty breathing, headache or visual disturbances    Complete by:  As directed      Call MD for:  extreme fatigue    Complete by:  As directed      Call MD for:  hives    Complete by:  As directed      Call MD for:  persistant dizziness or light-headedness    Complete by:  As directed      Call MD for:  persistant nausea and vomiting    Complete by:  As directed      Call MD for:  severe uncontrolled pain    Complete by:  As directed      Call MD for:  temperature >100.4    Complete by:  As directed      Diet general    Complete by:  As directed      Increase activity slowly    Complete by:  As directed             Medication List    STOP taking these medications        amLODipine 5 MG tablet  Commonly known as:  NORVASC     celecoxib 200 MG capsule  Commonly known as:  CELEBREX     citalopram 40 MG tablet  Commonly known as:  CELEXA     furosemide 20 MG tablet  Commonly known as:  LASIX      TAKE these medications        cephALEXin 500 MG capsule  Commonly known as:  KEFLEX  Take 1 capsule (500 mg total) by mouth 2 (two) times daily.     cyclopentolate 1 % ophthalmic solution  Commonly known as:  CYCLODRYL,CYCLOGYL  Place 1 drop into the right eye 2 (two) times daily.     docusate sodium 100 MG capsule  Commonly known as:  COLACE  Take 1 capsule (100 mg total) by mouth 2 (two) times daily.     feeding supplement (ENSURE ENLIVE) Liqd  Take 237 mLs by mouth 2 (two) times daily between meals.     levothyroxine 25 MCG tablet  Commonly known as:  SYNTHROID, LEVOTHROID  Take 1 tablet (25 mcg  total) by mouth daily.     oxyCODONE-acetaminophen 5-325 MG per tablet  Commonly known as:  PERCOCET/ROXICET  Take 1 tablet by mouth every 4 (four) hours as needed for moderate pain or severe pain.     prednisoLONE acetate 1 % ophthalmic suspension  Commonly known as:  PRED FORTE  Place 1 drop into the right eye 2 (two) times daily.     ranitidine 75 MG tablet  Commonly  known as:  ZANTAC  Take 75 mg by mouth 2 (two) times daily.     saccharomyces boulardii 250 MG capsule  Commonly known as:  FLORASTOR  Take 1 capsule (250 mg total) by mouth 2 (two) times daily.     timolol 0.5 % ophthalmic solution  Commonly known as:  TIMOPTIC  Place 1 drop into the left eye 2 (two) times daily.     TRAVATAN Z 0.004 % Soln ophthalmic solution  Generic drug:  Travoprost (BAK Free)  Place 1 drop into the left eye at bedtime.     Vitamin D 2000 UNITS Caps  Take by mouth.       Follow-up Information    Follow up with [facility], NP.   Specialty:  Nurse Practitioner   Why:  As needed   Contact information:   [address] [place] [address] [phone]        The results of significant diagnostics from this hospitalization (including imaging, microbiology, ancillary and laboratory) are listed below for reference.    Significant Diagnostic Studies: Dg Chest 2 View  12/08/2014   CLINICAL DATA:  Hypotension.  History of breast carcinoma.  EXAM: CHEST  2 VIEW  COMPARISON:  October 22, 2014  FINDINGS: There is stable elevation of the right hemidiaphragm. There is atelectasis in the right base. There is no edema or consolidation. Heart size and pulmonary vascularity are within normal limits. There is calcification throughout the aorta as well as in the mitral annulus. There are surgical clips the right axilla. No adenopathy. No bone lesions.  IMPRESSION: Stable marked elevation of the right hemidiaphragm with right base atelectasis. No edema or consolidation. No change in cardiac  silhouette.   Electronically Signed   By: Lowella Grip III M.D.   On: 12/08/2014 15:28   Ct Head Wo Contrast  12/08/2014   CLINICAL DATA:  81 year old hypertensive female with history of breast cancer and blindness right eye presenting with weakness. Initial encounter.  EXAM: CT HEAD WITHOUT CONTRAST  TECHNIQUE: Contiguous axial images were obtained from the base of the skull through the vertex without intravenous contrast.  COMPARISON:  None.  FINDINGS: No intracranial hemorrhage.  No CT evidence of large acute infarct.  No hydrocephalus.  Mild age related atrophy.  No intracranial mass lesion noted on this unenhanced exam.  Vascular calcifications.  Right phthisis bulbi.  No calvarial abnormality.  IMPRESSION: No intracranial hemorrhage or CT evidence of large acute infarct.   Electronically Signed   By: Genia Del M.D.   On: 12/08/2014 15:11    Microbiology: Recent Results (from the past 240 hour(s))  Urine culture     Status: None   Collection Time: 12/08/14  4:41 PM  Result Value Ref Range Status   Specimen Description URINE, CATHETERIZED  Final   Special Requests Normal  Final   Culture   Final    >=100,000 COLONIES/mL ESCHERICHIA COLI Performed at [facility]    Report Status 12/11/2014 FINAL  Final   Organism ID, Bacteria ESCHERICHIA COLI  Final      Susceptibility   Escherichia coli - MIC*    AMPICILLIN <=2 SENSITIVE Sensitive     CEFAZOLIN <=4 SENSITIVE Sensitive     CEFTRIAXONE <=1 SENSITIVE Sensitive     CIPROFLOXACIN <=0.25 SENSITIVE Sensitive     GENTAMICIN <=1 SENSITIVE Sensitive     IMIPENEM <=0.25 SENSITIVE Sensitive     NITROFURANTOIN <=16 SENSITIVE Sensitive     TRIMETH/SULFA <=20 SENSITIVE Sensitive  AMPICILLIN/SULBACTAM <=2 SENSITIVE Sensitive     PIP/TAZO <=4 SENSITIVE Sensitive     * >=100,000 COLONIES/mL ESCHERICHIA COLI     Labs: Basic Metabolic Panel:  Recent Labs Lab 12/08/14 1447 12/09/14 0536 12/10/14 0437 12/11/14 0435  NA  134* 136 138 139  K 4.0 3.4* 4.0 3.8  CL 101 106 108 109  CO2 23 23 21* 23  GLUCOSE 111* 109* 128* 118*  BUN 56* 53* 45* 43*  CREATININE 2.55* 2.35* 2.26* 1.90*  CALCIUM 9.2 9.0 9.5 9.9   Liver Function Tests:  Recent Labs Lab 12/08/14 1447  AST 14*  ALT 9*  ALKPHOS 77  BILITOT 0.5  PROT 5.8*  ALBUMIN 2.7*   No results for input(s): LIPASE, AMYLASE in the last 168 hours. No results for input(s): AMMONIA in the last 168 hours. CBC:  Recent Labs Lab 12/08/14 1448 12/09/14 0536 12/10/14 0437 12/11/14 0435  WBC 17.1* 20.7* 18.8* 19.9*  NEUTROABS 14.5*  --   --   --   HGB 10.1* 9.9* 10.2* 10.9*  HCT 31.2* 31.2* 31.0* 33.3*  MCV 93.1 92.3 93.1 93.8  PLT 187 176 198 219   Cardiac Enzymes:  Recent Labs Lab 12/08/14 1445  TROPONINI <0.03   BNP: BNP (last 3 results)  Recent Labs  10/22/14 2238  BNP 739.7*    ProBNP (last 3 results) No results for input(s): PROBNP in the last 8760 hours.  CBG: No results for input(s): GLUCAP in the last 168 hours.  Time coordinating discharge: 35 minutes  Signed:  Shafter Jupin  [facility] 12/11/2014, 1:22 PM

## 2014-12-11 NOTE — Clinical Social Work Placement (Signed)
   CLINICAL SOCIAL WORK PLACEMENT  NOTE  Date:  12/11/2014  Patient Details  Name: Holly Mckee MRN: [id number] Date of Birth: [birth year]  Clinical Social Work is seeking post-discharge placement for this patient at the [place] level of care (*CSW will initial, date and re-position this form in  chart as items are completed):  Yes   Patient/family provided with [place] Work Department's list of facilities offering this level of care within the geographic area requested by the patient (or if unable, by the patient's family).  Yes   Patient/family informed of their freedom to choose among providers that offer the needed level of care, that participate in Medicare, Medicaid or managed care program needed by the patient, have an available bed and are willing to accept the patient.  Yes   Patient/family informed of [facility]'s ownership interest in [facility] and [facility], as well as of the fact that they are under no obligation to receive care at these facilities.  PASRR submitted to EDS on 12/10/14     PASRR number received on 12/10/14     Existing PASRR number confirmed on       FL2 transmitted to all facilities in geographic area requested by pt/family on 12/10/14     FL2 transmitted to all facilities within larger geographic area on 12/10/14     Patient informed that his/her managed care company has contracts with or will negotiate with certain facilities, including the following:        Yes   Patient/family informed of bed offers received.  Patient [facility] bed at [facility]     Physician recommends and patient chooses bed at      Patient to be transferred to   on  .  Patient to be transferred to facility by       Patient family notified on   of transfer.  Name of family member notified:        PHYSICIAN Please sign FL2     Additional Comment:     _______________________________________________ Ladell Pier, LCSW 12/11/2014, 11:18 AM

## 2014-12-11 NOTE — Clinical Social Work Placement (Signed)
Patient is set to discharge to [facility] today. Patient & daughters, [name] at bedside aware. Discharge packet given to RN, Manuela Schwartz. [facility] called for transport.     Raynaldo Opitz, [facility] Clinical Social Worker cell #: [phone]    CLINICAL SOCIAL WORK PLACEMENT  NOTE  Date:  12/11/2014  Patient Details  Name: Holly Mckee MRN: [id number] Date of Birth: 1920/04/10  Clinical Social Work is seeking post-discharge placement for this patient at the [place] level of care (*CSW will initial, date and re-position this form in  chart as items are completed):  Yes   Patient/family provided with [place] Work Department's list of facilities offering this level of care within the geographic area requested by the patient (or if unable, by the patient's family).  Yes   Patient/family informed of their freedom to choose among providers that offer the needed level of care, that participate in Medicare, Medicaid or managed care program needed by the patient, have an available bed and are willing to accept the patient.  Yes   Patient/family informed of [facility]'s ownership interest in [facility] and [facility], as well as of the fact that they are under no obligation to receive care at these facilities.  PASRR submitted to EDS on 12/10/14     PASRR number received on 12/10/14     Existing PASRR number confirmed on       FL2 transmitted to all facilities in geographic area requested by pt/family on 12/10/14     FL2 transmitted to all facilities within larger geographic area on 12/10/14     Patient informed that his/her managed care company has contracts with or will negotiate with certain facilities, including the following:        Yes   Patient/family informed of bed offers received.  Patient chooses bed at [facility]     Physician recommends and patient chooses bed at      Patient to  be transferred to [facility] on 12/11/14.  Patient to be transferred to facility by [facility]     Patient family notified on 12/11/14 of transfer.  Name of family member notified:  patient's daughters, [name] at bedside     PHYSICIAN       Additional Comment:    _______________________________________________ Standley Brooking, LCSW 12/11/2014, 2:45 PM

## 2014-12-11 NOTE — Progress Notes (Signed)
Called [facility] and talked with Dawn Lpn and gave report.

## 2018-10-18 ENCOUNTER — Other Ambulatory Visit: Payer: Self-pay

## 2018-10-18 ENCOUNTER — Emergency Department (HOSPITAL_COMMUNITY)
Admission: EM | Admit: 2018-10-18 | Discharge: 2018-10-18 | Disposition: A | Discharge status: Home / Self Care | Payer: MEDICARE | Attending: Emergency Medicine | Admitting: Emergency Medicine

## 2018-10-18 ENCOUNTER — Emergency Department (HOSPITAL_COMMUNITY): Payer: MEDICARE

## 2018-10-18 ENCOUNTER — Encounter (HOSPITAL_COMMUNITY): Payer: Self-pay | Admitting: Emergency Medicine

## 2018-10-18 DIAGNOSIS — R410 Disorientation, unspecified: Secondary | ICD-10-CM | POA: Insufficient documentation

## 2018-10-18 DIAGNOSIS — W19XXXA Unspecified fall, initial encounter: Secondary | ICD-10-CM | POA: Insufficient documentation

## 2018-10-18 DIAGNOSIS — Z79899 Other long term (current) drug therapy: Secondary | ICD-10-CM | POA: Insufficient documentation

## 2018-10-18 DIAGNOSIS — Z23 Encounter for immunization: Secondary | ICD-10-CM | POA: Insufficient documentation

## 2018-10-18 DIAGNOSIS — S59902A Unspecified injury of left elbow, initial encounter: Secondary | ICD-10-CM | POA: Diagnosis present

## 2018-10-18 DIAGNOSIS — S51012A Laceration without foreign body of left elbow, initial encounter: Secondary | ICD-10-CM | POA: Diagnosis not present

## 2018-10-18 DIAGNOSIS — S51019A Laceration without foreign body of unspecified elbow, initial encounter: Secondary | ICD-10-CM

## 2018-10-18 DIAGNOSIS — I10 Essential (primary) hypertension: Secondary | ICD-10-CM | POA: Diagnosis not present

## 2018-10-18 DIAGNOSIS — Y939 Activity, unspecified: Secondary | ICD-10-CM | POA: Diagnosis not present

## 2018-10-18 DIAGNOSIS — Y999 Unspecified external cause status: Secondary | ICD-10-CM | POA: Diagnosis not present

## 2018-10-18 DIAGNOSIS — Y929 Unspecified place or not applicable: Secondary | ICD-10-CM | POA: Diagnosis not present

## 2018-10-18 HISTORY — DX: Unspecified dementia, unspecified severity, without behavioral disturbance, psychotic disturbance, mood disturbance, and anxiety: F03.90

## 2018-10-18 LAB — URINALYSIS, ROUTINE W REFLEX MICROSCOPIC
Bilirubin Urine: NEGATIVE
Glucose, UA: NEGATIVE mg/dL
Hgb urine dipstick: NEGATIVE
Ketones, ur: 5 mg/dL — AB
Nitrite: NEGATIVE
Protein, ur: NEGATIVE mg/dL
Specific Gravity, Urine: 1.012 (ref 1.005–1.030)
pH: 6 (ref 5.0–8.0)

## 2018-10-18 LAB — CBC WITH DIFFERENTIAL/PLATELET
Abs Immature Granulocytes: 0.04 10*3/uL (ref 0.00–0.07)
Basophils Absolute: 0.1 10*3/uL (ref 0.0–0.1)
Basophils Relative: 1 %
Eosinophils Absolute: 0.3 10*3/uL (ref 0.0–0.5)
Eosinophils Relative: 3 %
HCT: 40 % (ref 36.0–46.0)
Hemoglobin: 12.7 g/dL (ref 12.0–15.0)
Immature Granulocytes: 0 %
Lymphocytes Relative: 17 %
Lymphs Abs: 1.7 10*3/uL (ref 0.7–4.0)
MCH: 30 pg (ref 26.0–34.0)
MCHC: 31.8 g/dL (ref 30.0–36.0)
MCV: 94.6 fL (ref 80.0–100.0)
Monocytes Absolute: 1.2 10*3/uL — ABNORMAL HIGH (ref 0.1–1.0)
Monocytes Relative: 11 %
Neutro Abs: 7.2 10*3/uL (ref 1.7–7.7)
Neutrophils Relative %: 68 %
Platelets: 176 10*3/uL (ref 150–400)
RBC: 4.23 MIL/uL (ref 3.87–5.11)
RDW: 14.2 % (ref 11.5–15.5)
WBC: 10.5 10*3/uL (ref 4.0–10.5)
nRBC: 0 % (ref 0.0–0.2)

## 2018-10-18 LAB — COMPREHENSIVE METABOLIC PANEL
ALT: 12 U/L (ref 0–44)
AST: 21 U/L (ref 15–41)
Albumin: 3.5 g/dL (ref 3.5–5.0)
Alkaline Phosphatase: 74 U/L (ref 38–126)
Anion gap: 13 (ref 5–15)
BUN: 32 mg/dL — ABNORMAL HIGH (ref 8–23)
CO2: 23 mmol/L (ref 22–32)
Calcium: 9.9 mg/dL (ref 8.9–10.3)
Chloride: 106 mmol/L (ref 98–111)
Creatinine, Ser: 2.18 mg/dL — ABNORMAL HIGH (ref 0.44–1.00)
GFR calc Af Amer: 21 mL/min — ABNORMAL LOW (ref >60)
GFR calc non Af Amer: 19 mL/min — ABNORMAL LOW (ref >60)
Glucose, Bld: 107 mg/dL — ABNORMAL HIGH (ref 70–99)
Potassium: 4 mmol/L (ref 3.5–5.1)
Sodium: 142 mmol/L (ref 135–145)
Total Bilirubin: 1.3 mg/dL — ABNORMAL HIGH (ref 0.3–1.2)
Total Protein: 6.4 g/dL — ABNORMAL LOW (ref 6.5–8.1)

## 2018-10-18 LAB — CK: Total CK: 112 U/L (ref 38–234)

## 2018-10-18 MED ORDER — TETANUS-DIPHTH-ACELL PERTUSSIS 5-2.5-18.5 LF-MCG/0.5 IM SUSP
0.5000 mL | Freq: Once | INTRAMUSCULAR | Status: AC
  Administered 2018-10-18: 10:00:00 0.5 mL via INTRAMUSCULAR
  Filled 2018-10-18: qty 0.5

## 2018-10-18 NOTE — ED Triage Notes (Signed)
Per [facility] pt coming from home was found this morning on the floor half clothed. Patient has dementia, however lives alone. Has home health care for her during the day. Patient is alone from 9pm-7am. Unsure of when fall occurred. Only noted injury is skin tear to left elbow. Patient placed in c-collar. Denies any pain, only paranoid.

## 2018-10-18 NOTE — ED Provider Notes (Signed)
[facility] EMERGENCY DEPARTMENT Provider Note   CSN: [id number] Arrival date & time: 10/18/18  0354    History   Chief Complaint No chief complaint on file.   HPI Holly Mckee is a 85 y.o. female who presents emergency department brought in by EMS after being found by home health on the floor for an unknown amount of time this morning.  Patient has a past medical history of dementia.  She has home health but is by herself from 9 PM until 7 AM every night.  She was found by her home healthcare worker on the floor with a skin tear to the left elbow.  Family called 911.  She did not appear to have any other obvious injuries is not complaining of pain.  Patient arrives with c-collar in place.  There is a level 5 caveat due to dementia.      HPI  Past Medical History:  Diagnosis Date   Blind right eye    Breast cancer    Hypertension    Osteoarthritis    Osteopenia     Patient Active Problem List   Diagnosis Date Noted   E-coli UTI 12/10/2014   General weakness 12/10/2014   Leukocytosis 12/10/2014   ARF (acute renal failure) ([place]) 12/10/2014   Normocytic anemia 12/10/2014   Hypokalemia 10/22/2014   Hypertension     Past Surgical History:  Procedure Laterality Date   ABDOMINAL HYSTERECTOMY     BACK SURGERY     BREAST LUMPECTOMY     TOTAL HIP ARTHROPLASTY     2     OB History   No obstetric history on file.      Home Medications    Prior to Admission medications   Medication Sig Start Date End Date Taking? Authorizing Provider  cephALEXin (KEFLEX) 500 MG capsule Take 1 capsule (500 mg total) by mouth 2 (two) times daily. 12/11/14   Janece Canterbury, MD  Cholecalciferol (VITAMIN D) 2000 UNITS CAPS Take by mouth.      [provider]  cyclopentolate (CYCLODRYL,CYCLOGYL) 1 % ophthalmic solution Place 1 drop into the right eye 2 (two) times daily. 09/20/14   [provider]  docusate sodium (COLACE) 100 MG  capsule Take 1 capsule (100 mg total) by mouth 2 (two) times daily. 12/11/14   Janece Canterbury, MD  feeding supplement, ENSURE ENLIVE, (ENSURE ENLIVE) LIQD Take 237 mLs by mouth 2 (two) times daily between meals. 12/11/14   Janece Canterbury, MD  levothyroxine (SYNTHROID, LEVOTHROID) 25 MCG tablet Take 1 tablet (25 mcg total) by mouth daily. 08/14/12   Orlena Sheldon, PA-C  oxyCODONE-acetaminophen (PERCOCET/ROXICET) 5-325 MG per tablet Take 1 tablet by mouth every 4 (four) hours as needed for moderate pain or severe pain. 12/11/14   Janece Canterbury, MD  prednisoLONE acetate (PRED FORTE) 1 % ophthalmic suspension Place 1 drop into the right eye 2 (two) times daily. 10/02/14   [provider]  ranitidine (ZANTAC) 75 MG tablet Take 75 mg by mouth 2 (two) times daily.      [provider]  saccharomyces boulardii (FLORASTOR) 250 MG capsule Take 1 capsule (250 mg total) by mouth 2 (two) times daily. 12/11/14   Janece Canterbury, MD  timolol (TIMOPTIC) 0.5 % ophthalmic solution Place 1 drop into the left eye 2 (two) times daily. 08/07/14   [provider]  TRAVATAN Z 0.004 % SOLN ophthalmic solution Place 1 drop into the left eye at bedtime. 09/23/14   [provider]    Family History Family History  Problem Relation Age of Onset   Hypertension Father    Hypertension Sister     Social History Social History   Tobacco Use   Smoking status: Never Smoker  Substance Use Topics   Alcohol use: No    Alcohol/week: 0.0 standard drinks   Drug use: No     Allergies   Benazepril and Norvasc [amlodipine besylate]   Review of Systems Review of Systems Unable to review systems due to dementia  Physical Exam Updated Vital Signs There were no vitals taken for this visit.  Physical Exam Vitals signs and nursing note reviewed.  Constitutional:      General: She is not in acute distress.    Appearance: She is well-developed. She is not diaphoretic.  HENT:      Head: Normocephalic and atraumatic.  Eyes:     General: No scleral icterus.    Conjunctiva/sclera: Conjunctivae normal.  Neck:     Musculoskeletal: Normal range of motion.  Cardiovascular:     Rate and Rhythm: Normal rate and regular rhythm.     Heart sounds: Normal heart sounds. No murmur. No friction rub. No gallop.   Pulmonary:     Effort: Pulmonary effort is normal. No respiratory distress.     Breath sounds: Normal breath sounds.  Abdominal:     General: Bowel sounds are normal. There is no distension.     Palpations: Abdomen is soft. There is no mass.     Tenderness: There is no abdominal tenderness. There is no guarding.  Musculoskeletal:     Comments: Skin tear over both the right and left elbows.  No obvious bony deformity or tenderness.  Able to move the right and left shoulder wrist and hands appropriately without any other obvious abnormalities.  Strong grip strength, bilateral radial pulses 2+ No pelvic tenderness or laxity on assessment. No obvious leg injuries.  Skin:    General: Skin is warm and dry.  Neurological:     Mental Status: She is alert. Mental status is at baseline. She is disoriented.     Comments: Patient believes that she was brought to the hospital to be shot  Psychiatric:        Behavior: Behavior normal.      ED Treatments / Results  Labs (all labs ordered are listed, but only abnormal results are displayed) Labs Reviewed - No data to display  EKG None  Radiology Dg Elbow Complete Left  Result Date: 10/18/2018 CLINICAL DATA:  Recent fall with posterior elbow lacerations EXAM: LEFT ELBOW - COMPLETE 3+ VIEW COMPARISON:  None. FINDINGS: Soft tissue swelling is noted posteriorly consistent with given history. No acute fracture is seen. IMPRESSION: No acute bony abnormality noted. Electronically Signed   By: Inez Catalina M.D.   On: 10/18/2018 09:33   Dg Elbow Complete Right (3+view)  Result Date: 10/18/2018 CLINICAL DATA:  Recent fall with  posterior lacerations. EXAM: RIGHT ELBOW - COMPLETE 3+ VIEW COMPARISON:  None. FINDINGS: Mild soft tissue swelling is noted over the olecranon consistent with the recent injury. No acute fracture or dislocation is seen. No joint effusion is noted. IMPRESSION: Mild soft tissue swelling without acute bony abnormality. Electronically Signed   By: Inez Catalina M.D.   On: 10/18/2018 09:33   Ct Head Wo Contrast  Result Date: 10/18/2018 CLINICAL DATA:  Head trauma, confusion. EXAM: CT HEAD WITHOUT CONTRAST CT CERVICAL SPINE WITHOUT CONTRAST TECHNIQUE: Multidetector CT imaging of the head and  cervical spine was performed following the standard protocol without intravenous contrast. Multiplanar CT image reconstructions of the cervical spine were also generated. COMPARISON:  CT scan of December 08, 2014. FINDINGS: CT HEAD FINDINGS Brain: Mild diffuse cortical atrophy is noted. Mild chronic ischemic white matter disease is noted. Old right cerebellar infarction is noted. No mass effect or midline shift is noted. Ventricular size is within normal limits. There is no evidence of mass lesion, hemorrhage or acute infarction. Vascular: No hyperdense vessel or unexpected calcification. Skull: Normal. Negative for fracture or focal lesion. Sinuses/Orbits: No acute finding. Other: None. CT CERVICAL SPINE FINDINGS Alignment: Mild grade 1 anterolisthesis of C5-6 is noted secondary to posterior facet joint hypertrophy. Skull base and vertebrae: No acute fracture. No primary bone lesion or focal pathologic process. Soft tissues and spinal canal: No prevertebral fluid or swelling. No visible canal hematoma. Disc levels: There is partial fusion of the anterior and posterior elements of C3-4 which may be due to degenerative change or congenital. Severe degenerative disc disease is noted at C4-5 and C6-7. Upper chest: Negative. Other: Degenerative changes are seen involving the posterior facet joints bilaterally. IMPRESSION: Mild diffuse  cortical atrophy. Mild chronic ischemic white matter disease. No acute intracranial abnormality seen. Multilevel degenerative disc disease is noted in the cervical spine. No fracture or other acute abnormality is noted. Electronically Signed   By: Marijo Conception M.D.   On: 10/18/2018 09:17   Ct Cervical Spine Wo Contrast  Result Date: 10/18/2018 CLINICAL DATA:  Head trauma, confusion. EXAM: CT HEAD WITHOUT CONTRAST CT CERVICAL SPINE WITHOUT CONTRAST TECHNIQUE: Multidetector CT imaging of the head and cervical spine was performed following the standard protocol without intravenous contrast. Multiplanar CT image reconstructions of the cervical spine were also generated. COMPARISON:  CT scan of December 08, 2014. FINDINGS: CT HEAD FINDINGS Brain: Mild diffuse cortical atrophy is noted. Mild chronic ischemic white matter disease is noted. Old right cerebellar infarction is noted. No mass effect or midline shift is noted. Ventricular size is within normal limits. There is no evidence of mass lesion, hemorrhage or acute infarction. Vascular: No hyperdense vessel or unexpected calcification. Skull: Normal. Negative for fracture or focal lesion. Sinuses/Orbits: No acute finding. Other: None. CT CERVICAL SPINE FINDINGS Alignment: Mild grade 1 anterolisthesis of C5-6 is noted secondary to posterior facet joint hypertrophy. Skull base and vertebrae: No acute fracture. No primary bone lesion or focal pathologic process. Soft tissues and spinal canal: No prevertebral fluid or swelling. No visible canal hematoma. Disc levels: There is partial fusion of the anterior and posterior elements of C3-4 which may be due to degenerative change or congenital. Severe degenerative disc disease is noted at C4-5 and C6-7. Upper chest: Negative. Other: Degenerative changes are seen involving the posterior facet joints bilaterally. IMPRESSION: Mild diffuse cortical atrophy. Mild chronic ischemic white matter disease. No acute intracranial  abnormality seen. Multilevel degenerative disc disease is noted in the cervical spine. No fracture or other acute abnormality is noted. Electronically Signed   By: Marijo Conception M.D.   On: 10/18/2018 09:17    Procedures Procedures (including critical care time)  Medications Ordered in ED Medications - No data to display   Initial Impression / Assessment and Plan / ED Course  I have reviewed the triage vital signs and the nursing notes.  Pertinent labs & imaging results that were available during my care of the patient were reviewed by me and considered in my medical decision making (see chart for details).  CC: Fall VS:  Vitals:   10/18/18 0815 10/18/18 0818  BP:  (!) 121/57  Pulse:  75  Resp:  18  Temp:  98.3 F (36.8 C)  TempSrc:  Oral  SpO2: 94% 98%    [id number] is gathered by EMS and family by phone. DDX: Fractures, subdural hematoma, rhabdomyolysis Labs: I reviewed the labs which show mild urinary contamination without evidence of infection, labs appear to be at baseline with serum creatinine slightly above her previous without evidence of AKI.  Negative CK Imaging: I personally reviewed the images (CT head, CT C-spine and bilateral elbow x-rays) which show(s) no acute abnormalities EKG: N/A MDM: Elderly female in no acute distress.  She fell in her kitchen was down for an unknown amount of time.  Imaging and labs are reassuring.  I cleaned and bandaged her bilateral elbow skin tears with Mepitel and gauze. Tetanus vaccination updated.  I have spoken with both her daughter Ms. Irish Elders and her granddaughter Jovita Kussmaul is a nurse here at short stay.  The patient will be discharged.  She has no evidence of rhabdomyolysis.  Urine appears contaminated.  She is not complaining of abdominal pain and has no tenderness on examination Patient disposition: Discharge Patient condition: Good. The patient appears reasonably screened and/or stabilized for discharge and I  doubt any other medical condition or other [facility] requiring further screening, evaluation, or treatment in the ED at this time prior to discharge. I have discussed lab and/or imaging findings with the patient and answered all questions/concerns to the best of my ability. I have discussed return precautions and OP follow up.      Final Clinical Impressions(s) / ED Diagnoses   Final diagnoses:  Fall, initial encounter  Skin tear of elbow without complication, unspecified laterality, initial encounter    ED Discharge Orders    None       Margarita Mail, PA-C 10/18/18 [place], DO 10/18/18 1054

## 2018-10-18 NOTE — ED Notes (Signed)
Patient transported to CT 

## 2018-10-18 NOTE — Discharge Instructions (Addendum)
Get help right away if: You have a red streak going away from your wound. The edges of the wound open up and separate. Your wound is bleeding, and the bleeding does not stop with gentle pressure. You have a rash. You faint. You have trouble breathing.

## 2019-04-17 DEATH — deceased

## 2019-11-11 IMAGING — DX LEFT ELBOW - COMPLETE 3+ VIEW
5 series · 5 of 5 positions shown · non-contrast
Comparison: None.

CLINICAL DATA: Recent fall with posterior elbow lacerations

EXAM:
LEFT ELBOW - COMPLETE 3+ VIEW

[elbow ap]
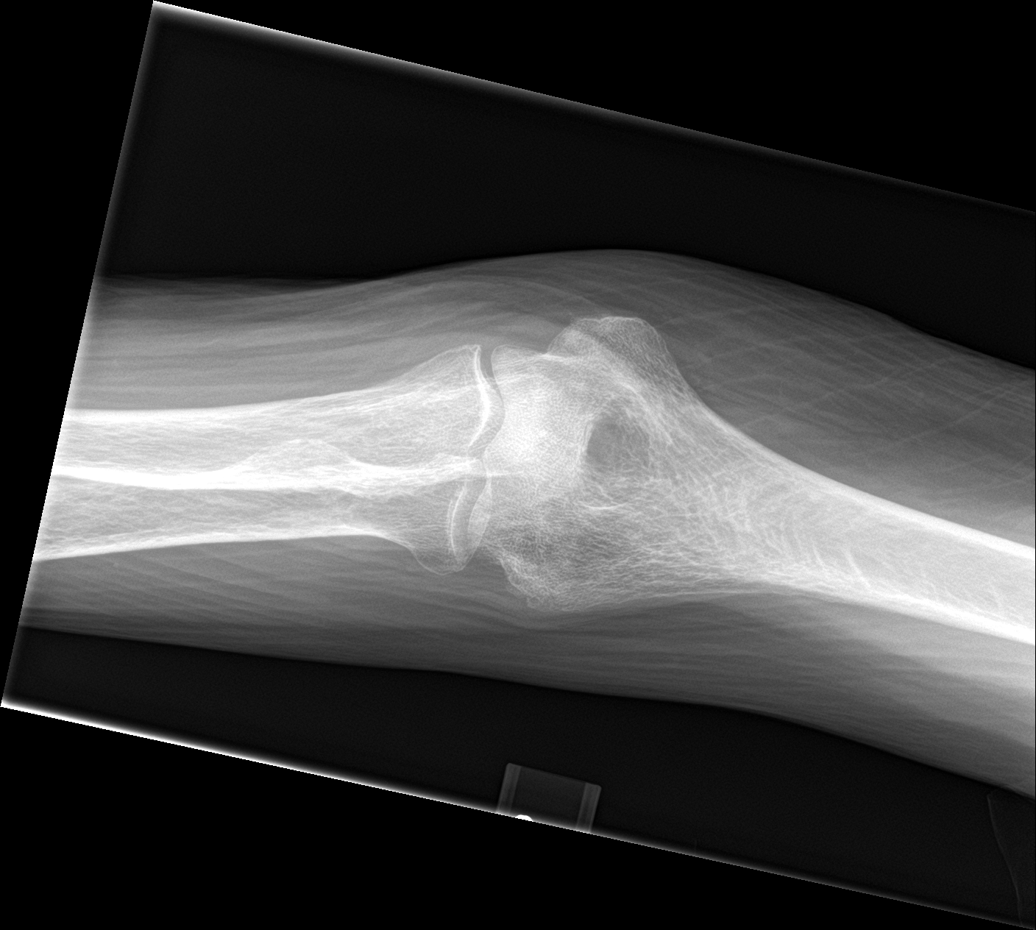

[elbow lat (1 of 2)]
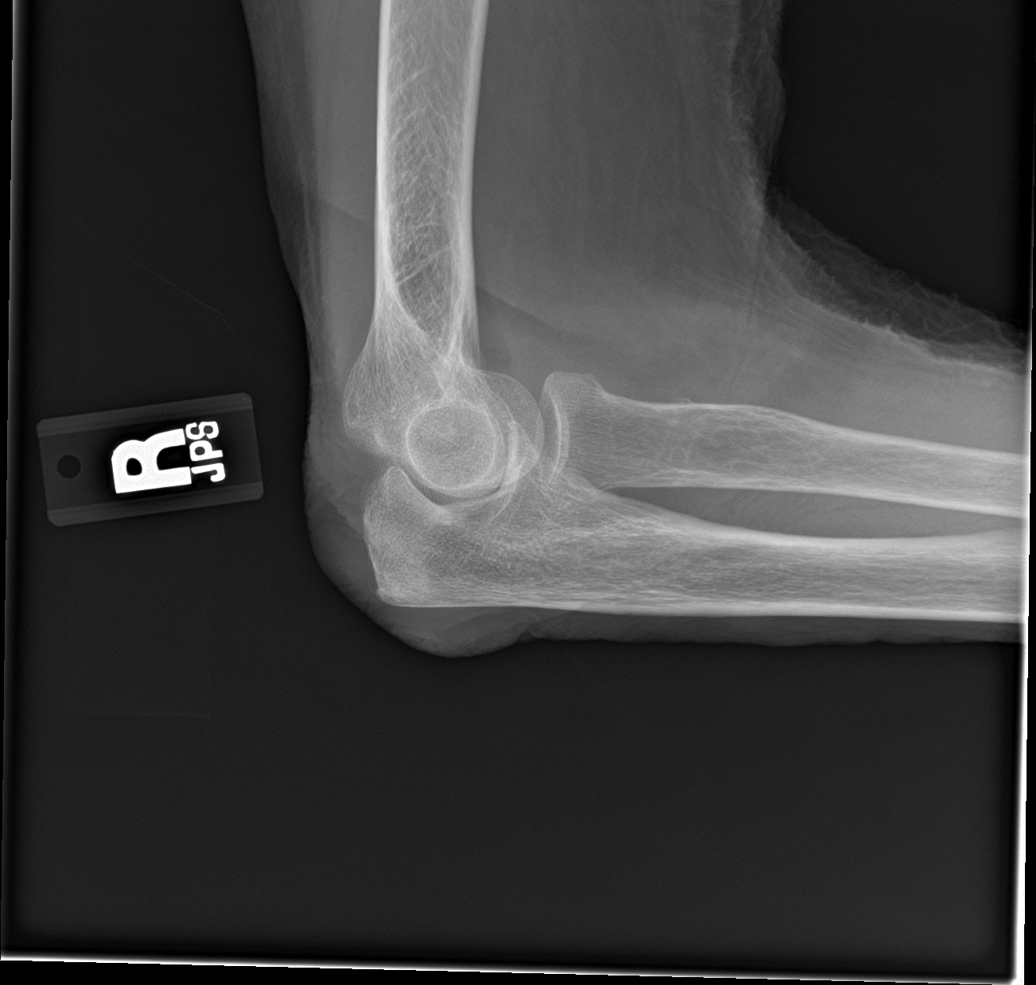

[elbow lat (2 of 2)]
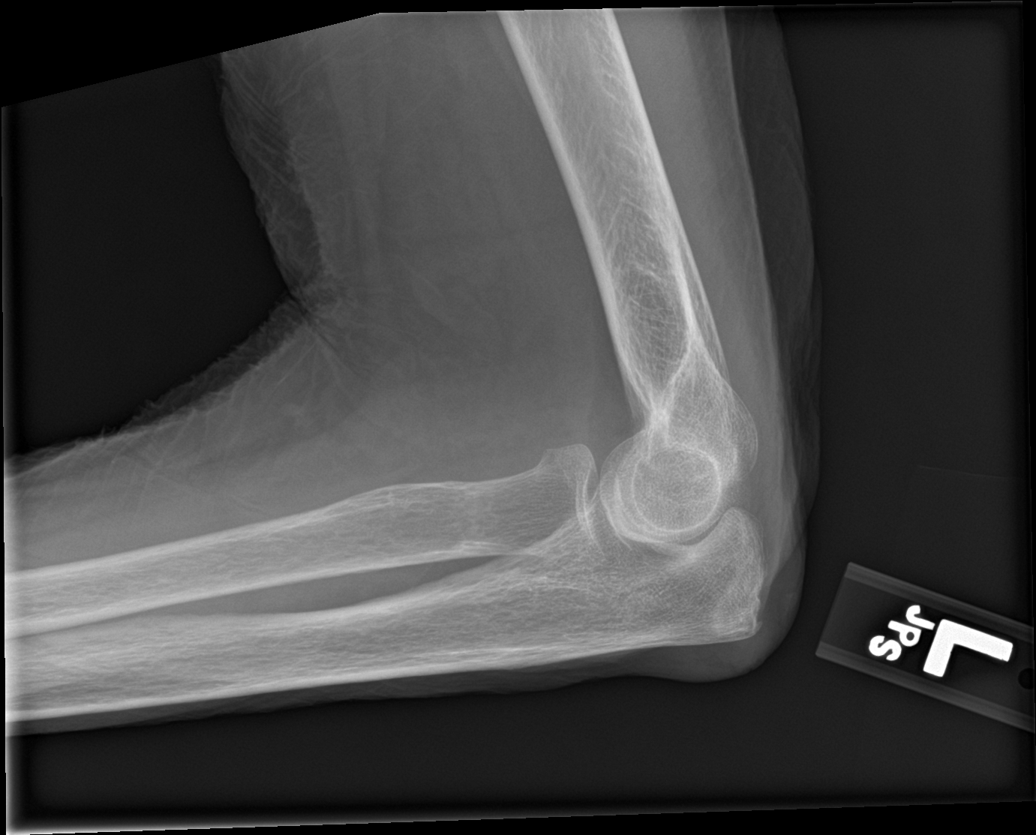

[elbow obl (1 of 2)]
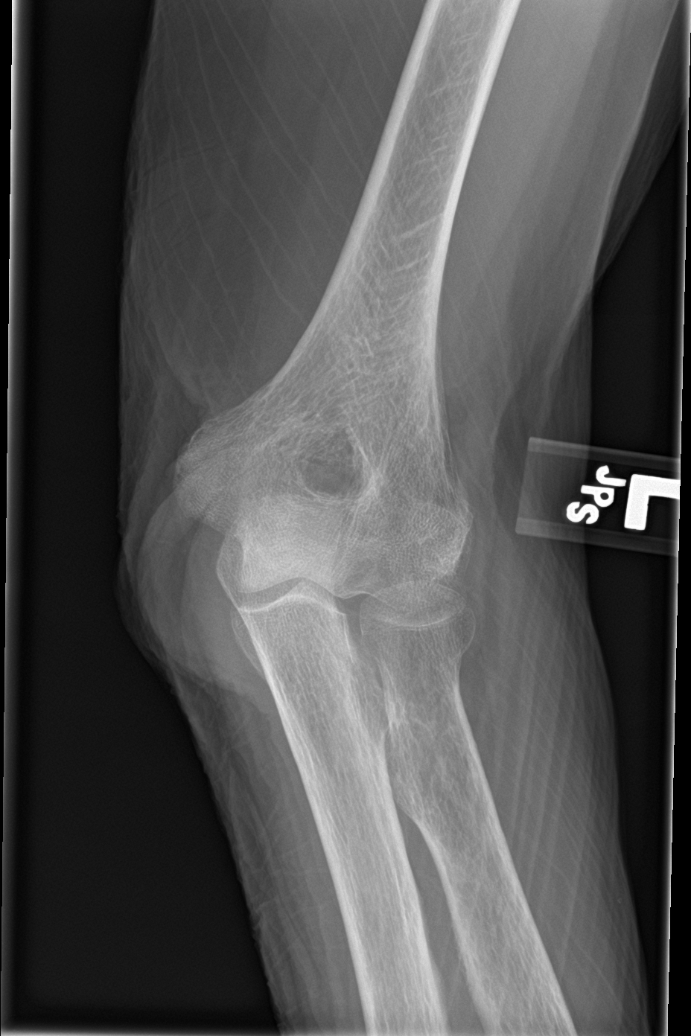

[elbow obl (2 of 2)]
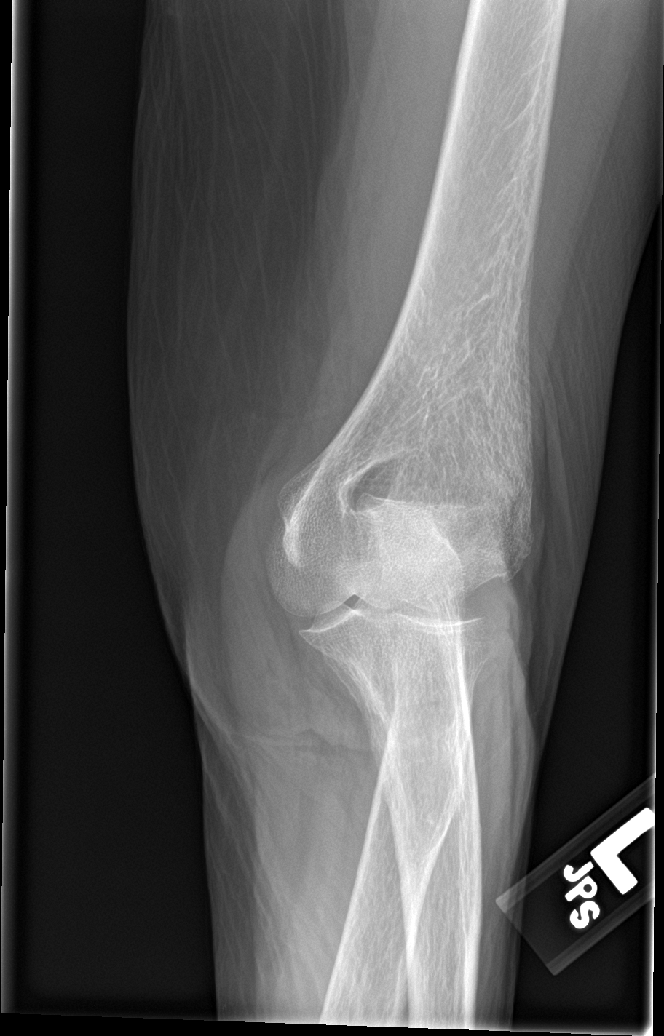

[5 of 5 positions shown; findings below may reference images not displayed]

FINDINGS: Soft tissue swelling is noted posteriorly consistent with given
history. No acute fracture is seen.
IMPRESSION: No acute bony abnormality noted.

## 2019-11-11 IMAGING — CT CT HEAD WITHOUT CONTRAST
3 of 4 series · 13 of 47 positions shown, 15 images · non-contrast
Comparison: CT scan of December 08, 2014.

CLINICAL DATA: Head trauma, confusion.

EXAM:
CT HEAD WITHOUT CONTRAST
CT CERVICAL SPINE WITHOUT CONTRAST
TECHNIQUE: Multidetector CT imaging of the head and cervical spine was
performed following the standard protocol without intravenous
contrast. Multiplanar CT image reconstructions of the cervical spine
were also generated.

[Series 3: head wo · axial · 0.40mm/px · z∈[-141,-31]mm · 7 of 30 slices shown, 9 images]
[im 4/30  brain]
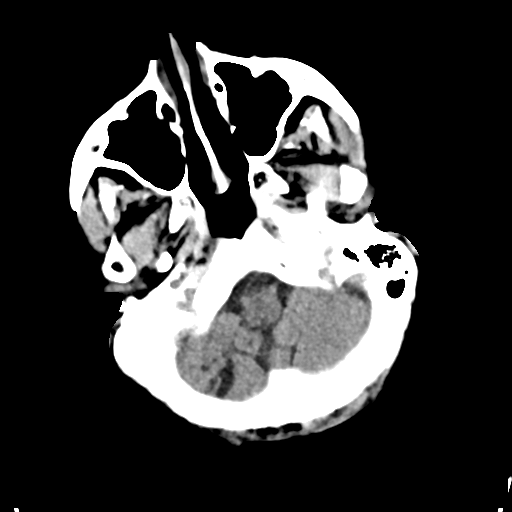
[im 4/30  bone]
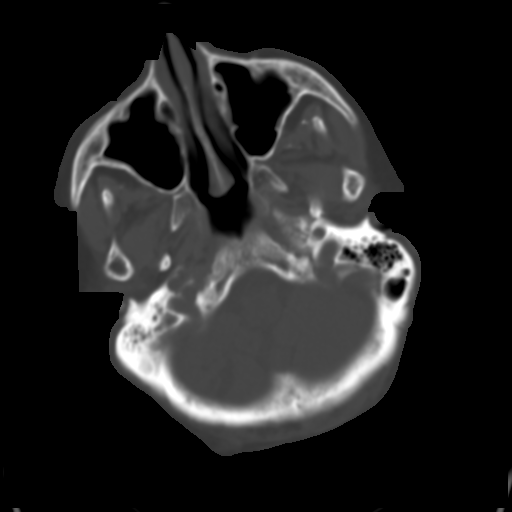
[im 8/30  brain]
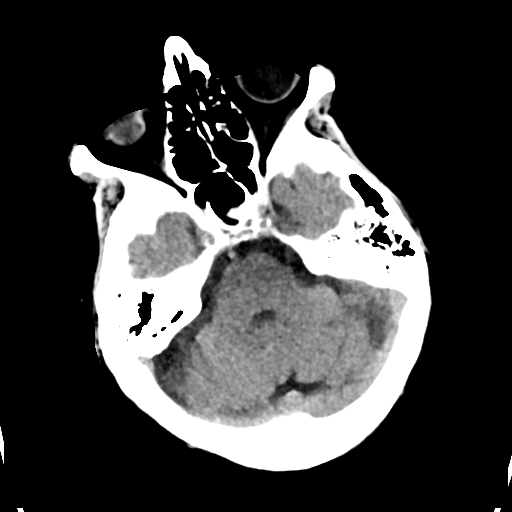
[im 11/30  brain]
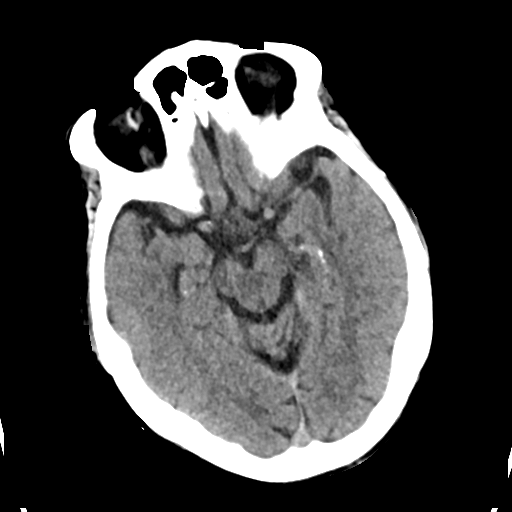
[im 15/30  brain]
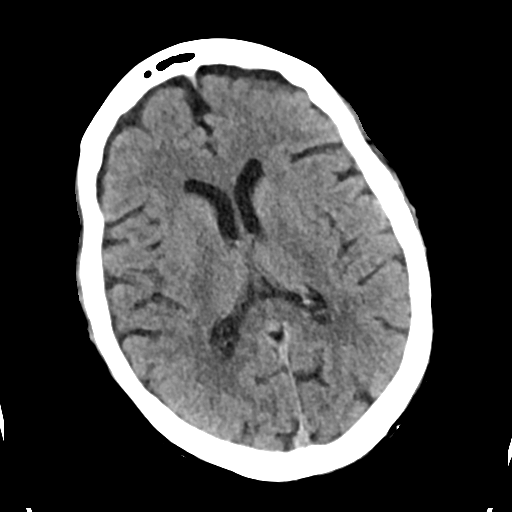
[im 19/30  brain]
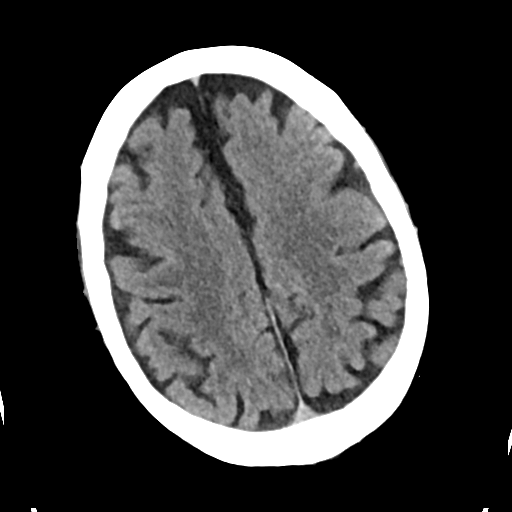
[im 19/30  bone]
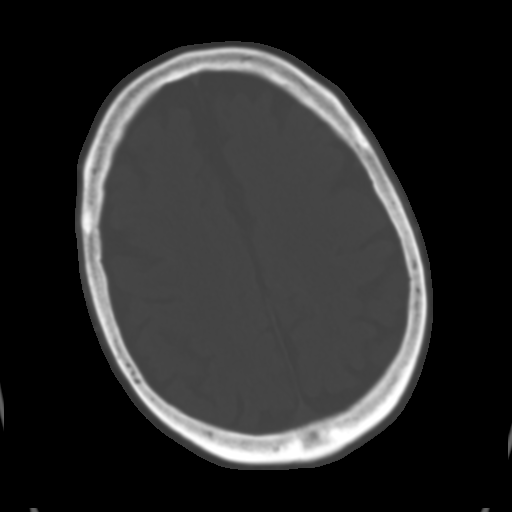
[im 22/30  brain]
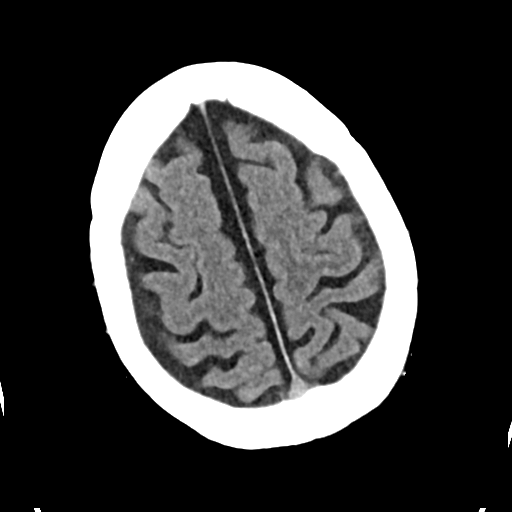
[im 26/30  brain]
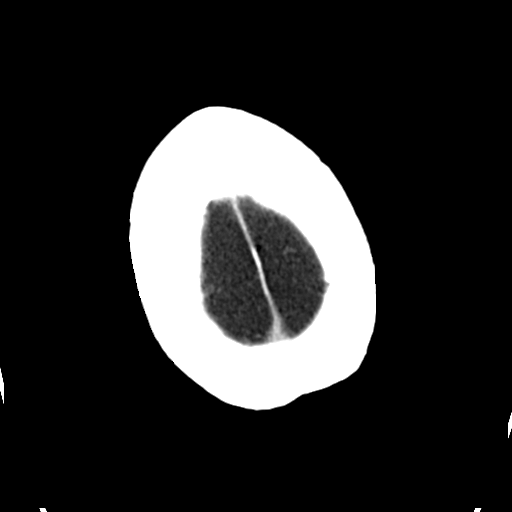

[Series 5: cor soft · coronal · 0.29mm/px · 3 of 67 slices shown]
[im 23/67  brain]
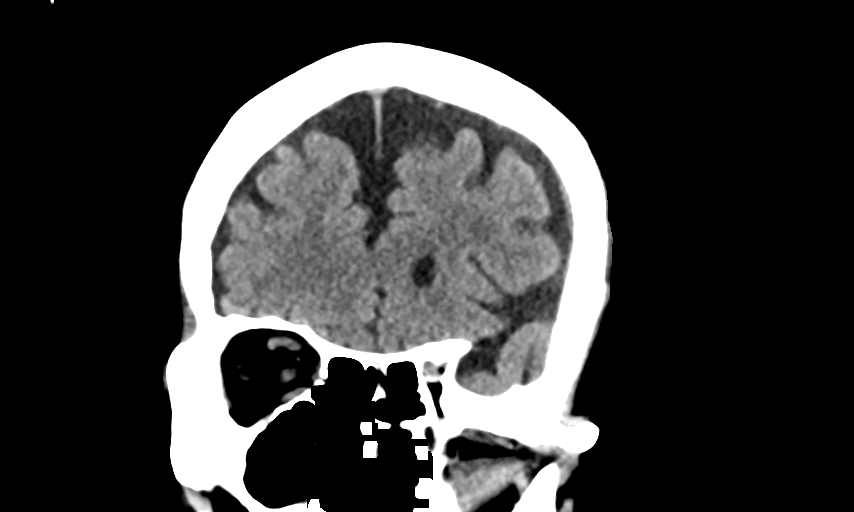
[im 30/67  brain]
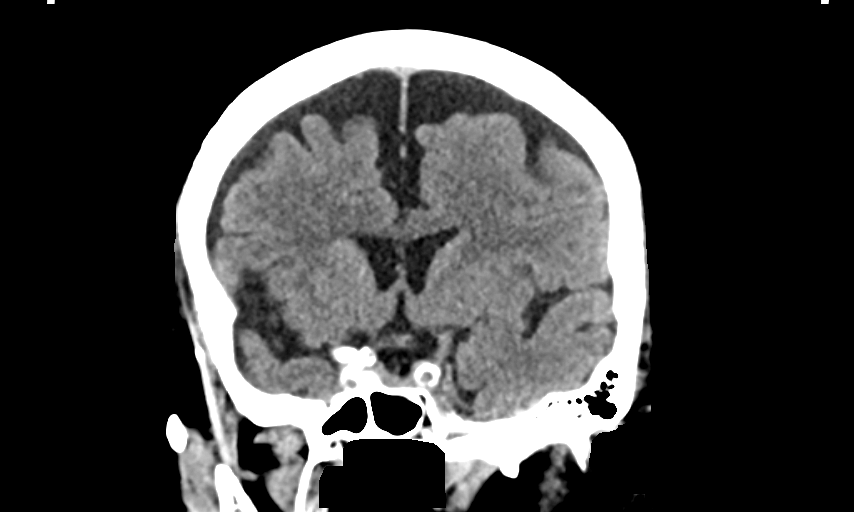
[im 37/67  brain]
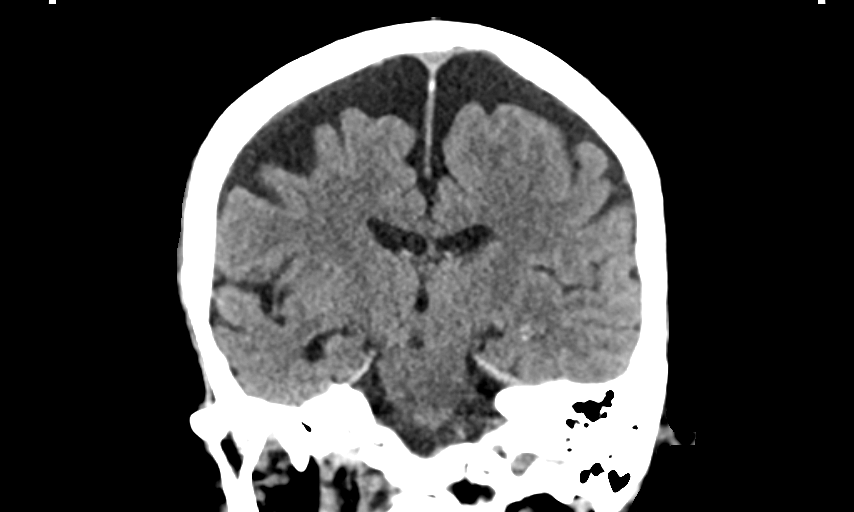

[Series 6: sag soft · sagittal · 0.29mm/px · 3 of 67 slices shown]
[im 23/67  brain]
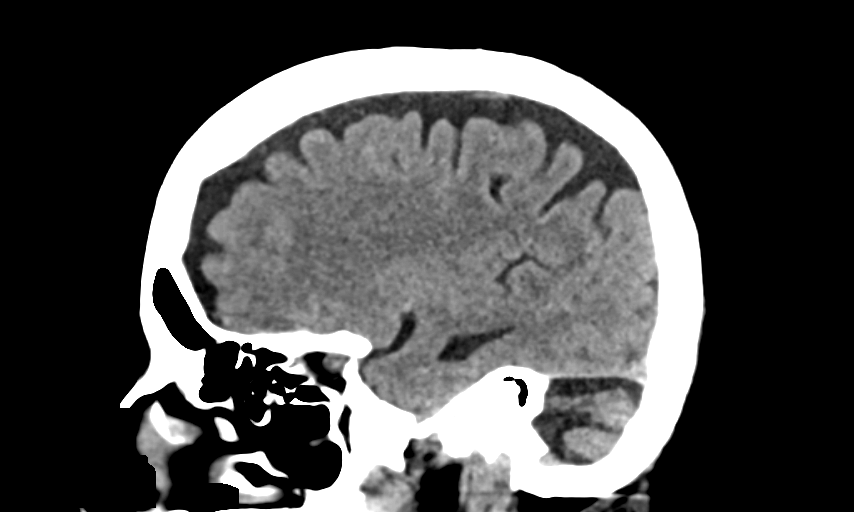
[im 34/67  brain]
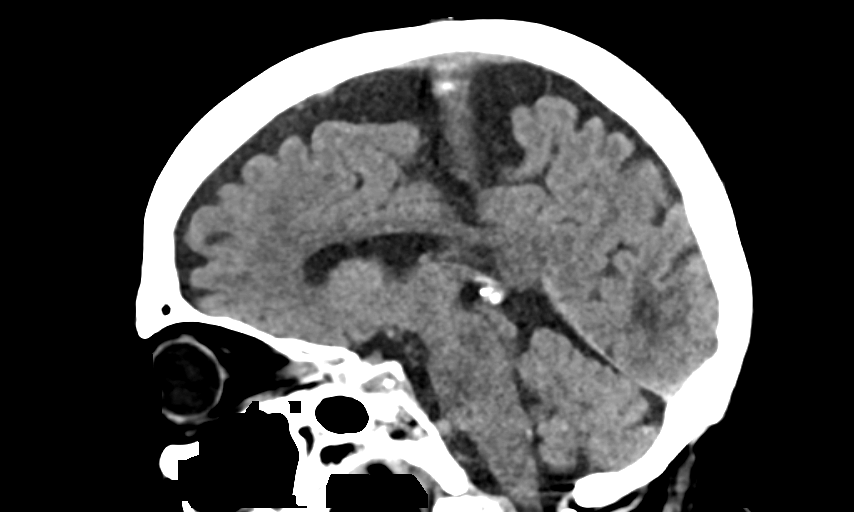
[im 45/67  brain]
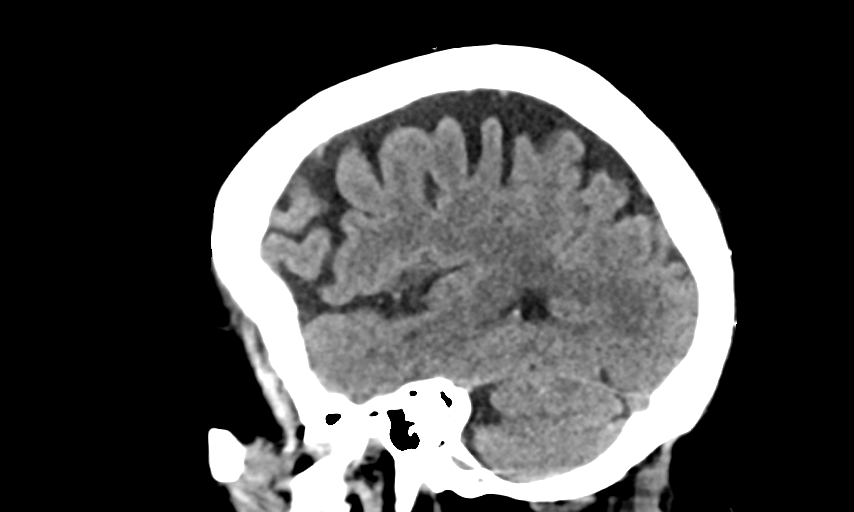

[13 of 47 positions shown; findings below may reference images not displayed]

FINDINGS: CT HEAD FINDINGS

Brain: Mild diffuse cortical atrophy is noted. Mild chronic ischemic
white matter disease is noted. Old right cerebellar infarction is
noted. No mass effect or midline shift is noted. Ventricular size is
within normal limits. There is no evidence of mass lesion,
hemorrhage or acute infarction.

Vascular: No hyperdense vessel or unexpected calcification.

Skull: Normal. Negative for fracture or focal lesion.

Sinuses/Orbits: No acute finding.

Other: None.

CT CERVICAL SPINE FINDINGS

Alignment: Mild grade 1 anterolisthesis of C5-6 is noted secondary
to posterior facet joint hypertrophy.

Skull base and vertebrae: No acute fracture. No primary bone lesion
or focal pathologic process.

Soft tissues and spinal canal: No prevertebral fluid or swelling. No
visible canal hematoma.

Disc levels: There is partial fusion of the anterior and posterior
elements of C3-4 which may be due to degenerative change or
congenital. Severe degenerative disc disease is noted at C4-5 and
C6-7.

Upper chest: Negative.

Other: Degenerative changes are seen involving the posterior facet
joints bilaterally.
IMPRESSION: Mild diffuse cortical atrophy. Mild chronic ischemic white matter
disease. No acute intracranial abnormality seen.

Multilevel degenerative disc disease is noted in the cervical spine.
No fracture or other acute abnormality is noted.
